# Patient Record
Sex: Male | Born: 1935 | Race: Black or African American | Hispanic: No | Smoking: Current every day smoker
Health system: Southern US, Community
[De-identification: ages and names within clinical notes are randomized; demographics above are authoritative.]

## PROBLEM LIST (undated history)

## (undated) DIAGNOSIS — I10 Essential (primary) hypertension: Secondary | ICD-10-CM

## (undated) DIAGNOSIS — F17200 Nicotine dependence, unspecified, uncomplicated: Secondary | ICD-10-CM

## (undated) DIAGNOSIS — J449 Chronic obstructive pulmonary disease, unspecified: Secondary | ICD-10-CM

## (undated) DIAGNOSIS — E7800 Pure hypercholesterolemia, unspecified: Secondary | ICD-10-CM

---

## 2015-08-20 ENCOUNTER — Inpatient Hospital Stay (HOSPITAL_COMMUNITY)
Admission: EM | Admit: 2015-08-20 | Discharge: 2015-08-29 | DRG: 208 | Disposition: A | Discharge status: Home Health | Payer: Medicare Other | Attending: Internal Medicine | Admitting: Internal Medicine

## 2015-08-20 ENCOUNTER — Encounter (HOSPITAL_COMMUNITY): Payer: Self-pay

## 2015-08-20 ENCOUNTER — Emergency Department (HOSPITAL_COMMUNITY): Payer: Medicare Other

## 2015-08-20 DIAGNOSIS — J209 Acute bronchitis, unspecified: Secondary | ICD-10-CM | POA: Diagnosis present

## 2015-08-20 DIAGNOSIS — Z4659 Encounter for fitting and adjustment of other gastrointestinal appliance and device: Secondary | ICD-10-CM

## 2015-08-20 DIAGNOSIS — G9341 Metabolic encephalopathy: Secondary | ICD-10-CM | POA: Diagnosis present

## 2015-08-20 DIAGNOSIS — J441 Chronic obstructive pulmonary disease with (acute) exacerbation: Secondary | ICD-10-CM

## 2015-08-20 DIAGNOSIS — J440 Chronic obstructive pulmonary disease with acute lower respiratory infection: Secondary | ICD-10-CM | POA: Diagnosis not present

## 2015-08-20 DIAGNOSIS — R0603 Acute respiratory distress: Secondary | ICD-10-CM

## 2015-08-20 DIAGNOSIS — Z9119 Patient's noncompliance with other medical treatment and regimen: Secondary | ICD-10-CM

## 2015-08-20 DIAGNOSIS — D696 Thrombocytopenia, unspecified: Secondary | ICD-10-CM | POA: Diagnosis present

## 2015-08-20 DIAGNOSIS — J9622 Acute and chronic respiratory failure with hypercapnia: Secondary | ICD-10-CM | POA: Diagnosis present

## 2015-08-20 DIAGNOSIS — Z7951 Long term (current) use of inhaled steroids: Secondary | ICD-10-CM

## 2015-08-20 DIAGNOSIS — I248 Other forms of acute ischemic heart disease: Secondary | ICD-10-CM | POA: Diagnosis present

## 2015-08-20 DIAGNOSIS — I639 Cerebral infarction, unspecified: Secondary | ICD-10-CM

## 2015-08-20 DIAGNOSIS — D751 Secondary polycythemia: Secondary | ICD-10-CM | POA: Diagnosis present

## 2015-08-20 DIAGNOSIS — Z9289 Personal history of other medical treatment: Secondary | ICD-10-CM

## 2015-08-20 DIAGNOSIS — R0689 Other abnormalities of breathing: Secondary | ICD-10-CM | POA: Diagnosis not present

## 2015-08-20 DIAGNOSIS — R001 Bradycardia, unspecified: Secondary | ICD-10-CM | POA: Diagnosis present

## 2015-08-20 DIAGNOSIS — Z7982 Long term (current) use of aspirin: Secondary | ICD-10-CM

## 2015-08-20 DIAGNOSIS — T380X5A Adverse effect of glucocorticoids and synthetic analogues, initial encounter: Secondary | ICD-10-CM | POA: Diagnosis present

## 2015-08-20 DIAGNOSIS — Z79899 Other long term (current) drug therapy: Secondary | ICD-10-CM

## 2015-08-20 DIAGNOSIS — R4182 Altered mental status, unspecified: Secondary | ICD-10-CM

## 2015-08-20 DIAGNOSIS — E785 Hyperlipidemia, unspecified: Secondary | ICD-10-CM | POA: Diagnosis present

## 2015-08-20 DIAGNOSIS — I10 Essential (primary) hypertension: Secondary | ICD-10-CM | POA: Diagnosis present

## 2015-08-20 DIAGNOSIS — J9692 Respiratory failure, unspecified with hypercapnia: Secondary | ICD-10-CM | POA: Diagnosis present

## 2015-08-20 DIAGNOSIS — R451 Restlessness and agitation: Secondary | ICD-10-CM | POA: Diagnosis not present

## 2015-08-20 DIAGNOSIS — R739 Hyperglycemia, unspecified: Secondary | ICD-10-CM | POA: Diagnosis present

## 2015-08-20 DIAGNOSIS — F17210 Nicotine dependence, cigarettes, uncomplicated: Secondary | ICD-10-CM | POA: Diagnosis present

## 2015-08-20 DIAGNOSIS — E876 Hypokalemia: Secondary | ICD-10-CM | POA: Diagnosis present

## 2015-08-20 DIAGNOSIS — J9601 Acute respiratory failure with hypoxia: Secondary | ICD-10-CM

## 2015-08-20 DIAGNOSIS — J9602 Acute respiratory failure with hypercapnia: Secondary | ICD-10-CM

## 2015-08-20 DIAGNOSIS — E7800 Pure hypercholesterolemia, unspecified: Secondary | ICD-10-CM | POA: Diagnosis present

## 2015-08-20 DIAGNOSIS — E872 Acidosis: Secondary | ICD-10-CM | POA: Diagnosis present

## 2015-08-20 DIAGNOSIS — J9621 Acute and chronic respiratory failure with hypoxia: Secondary | ICD-10-CM | POA: Diagnosis present

## 2015-08-20 HISTORY — DX: Pure hypercholesterolemia, unspecified: E78.00

## 2015-08-20 HISTORY — DX: Chronic obstructive pulmonary disease, unspecified: J44.9

## 2015-08-20 HISTORY — DX: Essential (primary) hypertension: I10

## 2015-08-20 HISTORY — DX: Nicotine dependence, unspecified, uncomplicated: F17.200

## 2015-08-20 LAB — BASIC METABOLIC PANEL
Anion gap: 7 (ref 5–15)
BUN: 11 mg/dL (ref 6–20)
CHLORIDE: 99 mmol/L — AB (ref 101–111)
CO2: 40 mmol/L — ABNORMAL HIGH (ref 22–32)
CREATININE: 0.79 mg/dL (ref 0.61–1.24)
Calcium: 9.6 mg/dL (ref 8.9–10.3)
Glucose, Bld: 109 mg/dL — ABNORMAL HIGH (ref 65–99)
Potassium: 3.9 mmol/L (ref 3.5–5.1)
SODIUM: 146 mmol/L — AB (ref 135–145)

## 2015-08-20 LAB — I-STAT VENOUS BLOOD GAS, ED
Acid-Base Excess: 13 mmol/L — ABNORMAL HIGH (ref 0.0–2.0)
Bicarbonate: 45.7 mEq/L — ABNORMAL HIGH (ref 20.0–24.0)
O2 Saturation: 64 %
PCO2 VEN: 95 mmHg — AB (ref 45.0–50.0)
PH VEN: 7.29 (ref 7.250–7.300)
TCO2: 49 mmol/L (ref 0–100)
pO2, Ven: 40 mmHg (ref 31.0–45.0)

## 2015-08-20 LAB — HEPATIC FUNCTION PANEL
ALT: 20 U/L (ref 17–63)
AST: 24 U/L (ref 15–41)
Albumin: 3.8 g/dL (ref 3.5–5.0)
Alkaline Phosphatase: 44 U/L (ref 38–126)
BILIRUBIN DIRECT: 0.2 mg/dL (ref 0.1–0.5)
BILIRUBIN INDIRECT: 0.3 mg/dL (ref 0.3–0.9)
BILIRUBIN TOTAL: 0.5 mg/dL (ref 0.3–1.2)
Total Protein: 7 g/dL (ref 6.5–8.1)

## 2015-08-20 LAB — TROPONIN I
TROPONIN I: 0.06 ng/mL — AB (ref <0.03)
TROPONIN I: 0.06 ng/mL — AB (ref <0.03)

## 2015-08-20 LAB — CBC
HCT: 55.6 % — ABNORMAL HIGH (ref 39.0–52.0)
Hemoglobin: 16.6 g/dL (ref 13.0–17.0)
MCH: 30.5 pg (ref 26.0–34.0)
MCHC: 29.9 g/dL — ABNORMAL LOW (ref 30.0–36.0)
MCV: 102.2 fL — AB (ref 78.0–100.0)
PLATELETS: 162 10*3/uL (ref 150–400)
RBC: 5.44 MIL/uL (ref 4.22–5.81)
RDW: 13.6 % (ref 11.5–15.5)
WBC: 6.2 10*3/uL (ref 4.0–10.5)

## 2015-08-20 LAB — BLOOD GAS, ARTERIAL
ACID-BASE EXCESS: 14.5 mmol/L — AB (ref 0.0–2.0)
BICARBONATE: 42.4 meq/L — AB (ref 20.0–24.0)
Delivery systems: POSITIVE
Drawn by: 331001
EXPIRATORY PAP: 6
FIO2: 0.4
Inspiratory PAP: 14
Mode: POSITIVE
O2 Saturation: 90.6 %
PATIENT TEMPERATURE: 98.6
PH ART: 7.222 — AB (ref 7.350–7.450)
PO2 ART: 68.2 mmHg — AB (ref 80.0–100.0)
TCO2: 45.7 mmol/L (ref 0–100)

## 2015-08-20 LAB — LACTIC ACID, PLASMA: LACTIC ACID, VENOUS: 0.6 mmol/L (ref 0.5–1.9)

## 2015-08-20 LAB — CBG MONITORING, ED: GLUCOSE-CAPILLARY: 108 mg/dL — AB (ref 65–99)

## 2015-08-20 LAB — AMMONIA: AMMONIA: 62 umol/L — AB (ref 9–35)

## 2015-08-20 MED ORDER — ASPIRIN EC 81 MG PO TBEC
162.0000 mg | DELAYED_RELEASE_TABLET | Freq: Once | ORAL | Status: AC
  Administered 2015-08-20: 162 mg via ORAL
  Filled 2015-08-20: qty 2

## 2015-08-20 MED ORDER — PREDNISONE 20 MG PO TABS
60.0000 mg | ORAL_TABLET | Freq: Once | ORAL | Status: AC
  Administered 2015-08-20: 60 mg via ORAL
  Filled 2015-08-20: qty 3

## 2015-08-20 MED ORDER — IPRATROPIUM BROMIDE 0.02 % IN SOLN
0.5000 mg | Freq: Once | RESPIRATORY_TRACT | Status: AC
Start: 2015-08-20 — End: 2015-08-21
  Administered 2015-08-21: 0.5 mg via RESPIRATORY_TRACT
  Filled 2015-08-20: qty 2.5

## 2015-08-20 MED ORDER — ALBUTEROL SULFATE (2.5 MG/3ML) 0.083% IN NEBU
10.0000 mg | INHALATION_SOLUTION | Freq: Once | RESPIRATORY_TRACT | Status: AC
  Administered 2015-08-20: 2.5 mg via RESPIRATORY_TRACT
  Filled 2015-08-20: qty 12

## 2015-08-20 MED ORDER — IPRATROPIUM BROMIDE 0.02 % IN SOLN
0.5000 mg | Freq: Once | RESPIRATORY_TRACT | Status: AC
  Administered 2015-08-20: 0.5 mg via RESPIRATORY_TRACT
  Filled 2015-08-20: qty 2.5

## 2015-08-20 MED ORDER — ALBUTEROL SULFATE (2.5 MG/3ML) 0.083% IN NEBU
10.0000 mg | INHALATION_SOLUTION | Freq: Once | RESPIRATORY_TRACT | Status: AC
  Administered 2015-08-20: 10 mg via RESPIRATORY_TRACT

## 2015-08-20 NOTE — ED Notes (Signed)
Patient here with grandson who reports that patient has had increased sleeping, decreased appetite and just feeling sluggish the past 2 days. Has had similar event in past and CO2 was elevated. Arrived with continous oxygenat 2L. Hx of COPD. Patient alert. But disoriented to place and time

## 2015-08-20 NOTE — ED Notes (Signed)
Pt placed on Bipap and tolerating well.

## 2015-08-20 NOTE — ED Provider Notes (Signed)
CSN: [id number]     Arrival date & time 08/20/15  1621 History   First MD Initiated Contact with Patient 08/20/15 1658     Chief Complaint  Patient presents with  . Weakness  . Altered Mental Status     (Consider location/radiation/quality/duration/timing/severity/associated sxs/prior Treatment) Patient is a 82 y.o. male presenting with altered mental status. The history is provided by the patient and a relative.  Altered Mental Status Presenting symptoms: behavior changes and confusion   Severity:  Moderate Most recent episode:  Today Episode history:  Multiple Timing:  Constant Progression:  Unchanged Context: not alcohol use, not dementia and not a recent illness   Associated symptoms: difficulty breathing   Associated symptoms: no abdominal pain, no agitation, no fever, no headaches, no nausea, no rash, no slurred speech and no weakness     Past Medical History  Diagnosis Date  . COPD (chronic obstructive pulmonary disease) (HCC)   . Hypertension   . High cholesterol    History reviewed. No pertinent past surgical history. No family history on file. Social History  Substance Use Topics  . Smoking status: Current Every Day Smoker    Types: Cigarettes  . Smokeless tobacco: None  . Alcohol Use: None    Review of Systems  Constitutional: Negative for fever, chills, diaphoresis and fatigue.  HENT: Negative for congestion.   Eyes: Negative for visual disturbance.  Respiratory: Positive for shortness of breath. Negative for cough and chest tightness.   Cardiovascular: Negative for chest pain.  Gastrointestinal: Negative for nausea and abdominal pain.  Genitourinary: Negative for dysuria and flank pain.  Musculoskeletal: Negative for neck pain.  Skin: Negative for rash.  Neurological: Negative for weakness, numbness and headaches.  Psychiatric/Behavioral: Positive for confusion. Negative for agitation.      Allergies  Review of patient's allergies indicates no  known allergies.  Home Medications   Prior to Admission medications   Medication Sig Start Date End Date Taking? Authorizing Provider  amLODipine (NORVASC) 10 MG tablet Take 1 tablet by mouth daily. 08/11/15  Yes Historical Provider, MD  aspirin 81 MG tablet Take 81 mg by mouth daily.   Yes Historical Provider, MD  fenofibrate (TRICOR) 145 MG tablet Take 1 tablet by mouth daily. 08/09/15  Yes Historical Provider, MD  metoprolol tartrate (LOPRESSOR) 25 MG tablet Take 1 tablet by mouth daily. 08/11/15  Yes Historical Provider, MD  SYMBICORT 160-4.5 MCG/ACT inhaler Inhale 2 puffs into the lungs daily. 07/12/15  Yes Historical Provider, MD   BP 173/82 mmHg  Pulse 91  Temp(Src) 98.6 F (37 C) (Oral)  Resp 20  SpO2 94% Physical Exam  Constitutional: He is oriented to person, place, and time. He appears well-developed and well-nourished. No distress.  HENT:  Head: Normocephalic and atraumatic.  Eyes: Conjunctivae are normal.  Cardiovascular: Normal rate and normal heart sounds.   No murmur heard. Pulmonary/Chest:  Diminished breath sounds bilaterally  Abdominal: Soft. There is no tenderness.  Musculoskeletal: He exhibits no edema.  Neurological: He is alert and oriented to person, place, and time. No cranial nerve deficit.  Skin: Skin is warm. He is not diaphoretic.  Psychiatric: He has a normal mood and affect. His behavior is normal.  Nursing note and vitals reviewed.   ED Course  Procedures (including critical care time) Labs Review Labs Reviewed  BASIC METABOLIC PANEL - Abnormal; Notable for the following:    Sodium 146 (*)    Chloride 99 (*)    CO2 40 (*)  Glucose, Bld 109 (*)    All other components within normal limits  CBC - Abnormal; Notable for the following:    HCT 55.6 (*)    MCV 102.2 (*)    MCHC 29.9 (*)    All other components within normal limits  CBG MONITORING, ED - Abnormal; Notable for the following:    Glucose-Capillary 108 (*)    All other components  within normal limits  URINALYSIS, ROUTINE W REFLEX MICROSCOPIC (NOT AT [facility])  CBG MONITORING, ED    Imaging Review No results found. I have personally reviewed and evaluated these images and lab results as part of my medical decision-making.   EKG Interpretation None      MDM   Final diagnoses:  None   Patient presents with confusion over the last patient presents with confusion over the last 12 hours.Grandson reports that he had multiple similar previous episodes in which she was told his carbon dioxide was too high. He was [facility] for acute hypercarbic respiratory failure.   On arrival his VBG shows pH of 7.29 with PCO2 95. BiPAP was started and pH worsened to 7.21 with PCO2 greater than 100. Repeat ABG again 7.21 with PCO2 greater than 100. Patient mental status unchanged with mild confusion and sleepiness.  Laboratory evaluation significant for normal white blood cell count, unremarkable BMP. Lactate wnl. Ammonia mildly elevated. Troponins 0.06 at 0 and 3 hours. Given 162 ASA in the ED. Chest pain free. Given 60 of prednisone in ED with 2 breathing treatments.   Patient admitted on BiPAP to step down unit for acute hypercarbic respiratory failure.   Levora AngelEric Mitzi Lilja, MD 08/20/15 2350  Azalia BilisKevin Campos, MD 08/20/15 2352

## 2015-08-21 ENCOUNTER — Encounter (HOSPITAL_COMMUNITY): Payer: Self-pay | Admitting: Internal Medicine

## 2015-08-21 ENCOUNTER — Inpatient Hospital Stay (HOSPITAL_COMMUNITY): Payer: Medicare Other

## 2015-08-21 DIAGNOSIS — R451 Restlessness and agitation: Secondary | ICD-10-CM | POA: Diagnosis not present

## 2015-08-21 DIAGNOSIS — J9600 Acute respiratory failure, unspecified whether with hypoxia or hypercapnia: Secondary | ICD-10-CM | POA: Diagnosis not present

## 2015-08-21 DIAGNOSIS — D696 Thrombocytopenia, unspecified: Secondary | ICD-10-CM | POA: Diagnosis present

## 2015-08-21 DIAGNOSIS — J9622 Acute and chronic respiratory failure with hypercapnia: Secondary | ICD-10-CM | POA: Diagnosis present

## 2015-08-21 DIAGNOSIS — R739 Hyperglycemia, unspecified: Secondary | ICD-10-CM | POA: Diagnosis present

## 2015-08-21 DIAGNOSIS — E7800 Pure hypercholesterolemia, unspecified: Secondary | ICD-10-CM | POA: Diagnosis present

## 2015-08-21 DIAGNOSIS — I248 Other forms of acute ischemic heart disease: Secondary | ICD-10-CM | POA: Diagnosis present

## 2015-08-21 DIAGNOSIS — Z7951 Long term (current) use of inhaled steroids: Secondary | ICD-10-CM | POA: Diagnosis not present

## 2015-08-21 DIAGNOSIS — E876 Hypokalemia: Secondary | ICD-10-CM | POA: Diagnosis present

## 2015-08-21 DIAGNOSIS — J9621 Acute and chronic respiratory failure with hypoxia: Secondary | ICD-10-CM | POA: Diagnosis present

## 2015-08-21 DIAGNOSIS — E785 Hyperlipidemia, unspecified: Secondary | ICD-10-CM | POA: Diagnosis present

## 2015-08-21 DIAGNOSIS — J9692 Respiratory failure, unspecified with hypercapnia: Secondary | ICD-10-CM | POA: Diagnosis present

## 2015-08-21 DIAGNOSIS — R0689 Other abnormalities of breathing: Secondary | ICD-10-CM | POA: Diagnosis present

## 2015-08-21 DIAGNOSIS — F17210 Nicotine dependence, cigarettes, uncomplicated: Secondary | ICD-10-CM | POA: Diagnosis present

## 2015-08-21 DIAGNOSIS — T380X5A Adverse effect of glucocorticoids and synthetic analogues, initial encounter: Secondary | ICD-10-CM | POA: Diagnosis present

## 2015-08-21 DIAGNOSIS — R404 Transient alteration of awareness: Secondary | ICD-10-CM | POA: Diagnosis not present

## 2015-08-21 DIAGNOSIS — I10 Essential (primary) hypertension: Secondary | ICD-10-CM | POA: Diagnosis present

## 2015-08-21 DIAGNOSIS — E872 Acidosis: Secondary | ICD-10-CM | POA: Diagnosis present

## 2015-08-21 DIAGNOSIS — Z9119 Patient's noncompliance with other medical treatment and regimen: Secondary | ICD-10-CM | POA: Diagnosis not present

## 2015-08-21 DIAGNOSIS — R7989 Other specified abnormal findings of blood chemistry: Secondary | ICD-10-CM | POA: Diagnosis not present

## 2015-08-21 DIAGNOSIS — J9601 Acute respiratory failure with hypoxia: Secondary | ICD-10-CM | POA: Diagnosis present

## 2015-08-21 DIAGNOSIS — J209 Acute bronchitis, unspecified: Secondary | ICD-10-CM | POA: Diagnosis present

## 2015-08-21 DIAGNOSIS — J441 Chronic obstructive pulmonary disease with (acute) exacerbation: Secondary | ICD-10-CM | POA: Diagnosis present

## 2015-08-21 DIAGNOSIS — Z79899 Other long term (current) drug therapy: Secondary | ICD-10-CM | POA: Diagnosis not present

## 2015-08-21 DIAGNOSIS — G9340 Encephalopathy, unspecified: Secondary | ICD-10-CM | POA: Diagnosis not present

## 2015-08-21 DIAGNOSIS — R001 Bradycardia, unspecified: Secondary | ICD-10-CM | POA: Diagnosis present

## 2015-08-21 DIAGNOSIS — J9602 Acute respiratory failure with hypercapnia: Secondary | ICD-10-CM

## 2015-08-21 DIAGNOSIS — Z7982 Long term (current) use of aspirin: Secondary | ICD-10-CM | POA: Diagnosis not present

## 2015-08-21 DIAGNOSIS — R410 Disorientation, unspecified: Secondary | ICD-10-CM | POA: Diagnosis not present

## 2015-08-21 DIAGNOSIS — J440 Chronic obstructive pulmonary disease with acute lower respiratory infection: Secondary | ICD-10-CM | POA: Diagnosis present

## 2015-08-21 DIAGNOSIS — D751 Secondary polycythemia: Secondary | ICD-10-CM | POA: Diagnosis present

## 2015-08-21 DIAGNOSIS — G9341 Metabolic encephalopathy: Secondary | ICD-10-CM | POA: Diagnosis present

## 2015-08-21 LAB — BLOOD GAS, ARTERIAL
ACID-BASE EXCESS: 14.9 mmol/L — AB (ref 0.0–2.0)
Acid-Base Excess: 10.9 mmol/L — ABNORMAL HIGH (ref 0.0–2.0)
BICARBONATE: 43 meq/L — AB (ref 20.0–24.0)
Bicarbonate: 34.4 mEq/L — ABNORMAL HIGH (ref 20.0–24.0)
Delivery systems: POSITIVE
Drawn by: 365271
Drawn by: 46203
EXPIRATORY PAP: 6
FIO2: 0.6
FIO2: 0.6
INSPIRATORY PAP: 18
MECHVT: 620 mL
O2 SAT: 93.7 %
O2 Saturation: 95.1 %
PATIENT TEMPERATURE: 98.2
PEEP: 5 cmH2O
PH ART: 7.22 — AB (ref 7.350–7.450)
PO2 ART: 80.5 mmHg (ref 80.0–100.0)
Patient temperature: 97.4
RATE: 15 resp/min
RATE: 20 resp/min
TCO2: 46.4 mmol/L (ref 0–100)
TCO2: 35.7 mmol/L (ref 0–100)
pCO2 arterial: 39.4 mmHg (ref 35.0–45.0)
pH, Arterial: 7.547 — ABNORMAL HIGH (ref 7.350–7.450)
pO2, Arterial: 60.7 mmHg — ABNORMAL LOW (ref 80.0–100.0)

## 2015-08-21 LAB — I-STAT ARTERIAL BLOOD GAS, ED
Acid-Base Excess: 3 mmol/L — ABNORMAL HIGH (ref 0.0–2.0)
Bicarbonate: 29.5 mEq/L — ABNORMAL HIGH (ref 20.0–24.0)
O2 Saturation: 95 %
PCO2 ART: 51.8 mmHg — AB (ref 35.0–45.0)
PH ART: 7.361 (ref 7.350–7.450)
PO2 ART: 81 mmHg (ref 80.0–100.0)
Patient temperature: 98
TCO2: 31 mmol/L (ref 0–100)

## 2015-08-21 LAB — BASIC METABOLIC PANEL
ANION GAP: 9 (ref 5–15)
BUN: 14 mg/dL (ref 6–20)
CALCIUM: 9.4 mg/dL (ref 8.9–10.3)
CO2: 35 mmol/L — AB (ref 22–32)
Chloride: 99 mmol/L — ABNORMAL LOW (ref 101–111)
Creatinine, Ser: 0.67 mg/dL (ref 0.61–1.24)
GFR calc non Af Amer: >60 mL/min (ref >60)
Glucose, Bld: 132 mg/dL — ABNORMAL HIGH (ref 65–99)
POTASSIUM: 4.4 mmol/L (ref 3.5–5.1)
Sodium: 143 mmol/L (ref 135–145)

## 2015-08-21 LAB — CBC
HEMATOCRIT: 56.3 % — AB (ref 39.0–52.0)
HEMOGLOBIN: 16.8 g/dL (ref 13.0–17.0)
MCH: 30.4 pg (ref 26.0–34.0)
MCHC: 29.8 g/dL — ABNORMAL LOW (ref 30.0–36.0)
MCV: 102 fL — AB (ref 78.0–100.0)
Platelets: 144 10*3/uL — ABNORMAL LOW (ref 150–400)
RBC: 5.52 MIL/uL (ref 4.22–5.81)
RDW: 13.5 % (ref 11.5–15.5)
WBC: 5.7 10*3/uL (ref 4.0–10.5)

## 2015-08-21 LAB — GLUCOSE, CAPILLARY
GLUCOSE-CAPILLARY: 178 mg/dL — AB (ref 65–99)
GLUCOSE-CAPILLARY: 181 mg/dL — AB (ref 65–99)
GLUCOSE-CAPILLARY: 173 mg/dL — AB (ref 65–99)
Glucose-Capillary: 116 mg/dL — ABNORMAL HIGH (ref 65–99)
Glucose-Capillary: 182 mg/dL — ABNORMAL HIGH (ref 65–99)

## 2015-08-21 LAB — TROPONIN I
TROPONIN I: 0.03 ng/mL — AB (ref <0.03)
Troponin I: 0.06 ng/mL (ref <0.03)
Troponin I: 0.06 ng/mL (ref <0.03)

## 2015-08-21 LAB — MRSA PCR SCREENING: MRSA by PCR: NEGATIVE

## 2015-08-21 MED ORDER — FAMOTIDINE IN NACL 20-0.9 MG/50ML-% IV SOLN
20.0000 mg | Freq: Two times a day (BID) | INTRAVENOUS | Status: DC
  Administered 2015-08-21 – 2015-08-24 (×8): 20 mg via INTRAVENOUS
  Filled 2015-08-21 (×9): qty 50

## 2015-08-21 MED ORDER — SODIUM CHLORIDE 0.9 % IV SOLN
INTRAVENOUS | Status: DC
  Administered 2015-08-21 – 2015-08-24 (×4): via INTRAVENOUS

## 2015-08-21 MED ORDER — CLONIDINE HCL 0.1 MG PO TABS
0.1000 mg | ORAL_TABLET | Freq: Three times a day (TID) | ORAL | Status: DC
  Administered 2015-08-21: 0.1 mg
  Filled 2015-08-21: qty 1

## 2015-08-21 MED ORDER — SODIUM CHLORIDE 0.9 % IV SOLN: 250.0000 mL | INTRAVENOUS | Status: DC | PRN

## 2015-08-21 MED ORDER — IPRATROPIUM-ALBUTEROL 0.5-2.5 (3) MG/3ML IN SOLN
3.0000 mL | Freq: Four times a day (QID) | RESPIRATORY_TRACT | Status: DC
  Administered 2015-08-21 – 2015-08-24 (×13): 3 mL via RESPIRATORY_TRACT
  Filled 2015-08-21 (×13): qty 3

## 2015-08-21 MED ORDER — ENOXAPARIN SODIUM 40 MG/0.4ML ~~LOC~~ SOLN
40.0000 mg | SUBCUTANEOUS | Status: DC
  Administered 2015-08-21 – 2015-08-29 (×9): 40 mg via SUBCUTANEOUS
  Filled 2015-08-21 (×9): qty 0.4

## 2015-08-21 MED ORDER — MIDAZOLAM HCL 2 MG/2ML IJ SOLN
4.0000 mg | Freq: Once | INTRAMUSCULAR | Status: AC
  Administered 2015-08-21: 4 mg via INTRAVENOUS

## 2015-08-21 MED ORDER — ROCURONIUM BROMIDE 50 MG/5ML IV SOLN
50.0000 mg | Freq: Once | INTRAVENOUS | Status: AC
  Administered 2015-08-21: 50 mg via INTRAVENOUS
  Filled 2015-08-21: qty 5

## 2015-08-21 MED ORDER — LEVOFLOXACIN IN D5W 500 MG/100ML IV SOLN
500.0000 mg | INTRAVENOUS | Status: DC
  Administered 2015-08-22 – 2015-08-25 (×4): 500 mg via INTRAVENOUS
  Filled 2015-08-21 (×5): qty 100

## 2015-08-21 MED ORDER — CHLORHEXIDINE GLUCONATE 0.12% ORAL RINSE (MEDLINE KIT)
15.0000 mL | Freq: Two times a day (BID) | OROMUCOSAL | Status: DC
  Administered 2015-08-21 – 2015-08-25 (×7): 15 mL via OROMUCOSAL

## 2015-08-21 MED ORDER — DEXMEDETOMIDINE HCL IN NACL 400 MCG/100ML IV SOLN
0.4000 ug/kg/h | INTRAVENOUS | Status: DC
  Administered 2015-08-21: 0.4 ug/kg/h via INTRAVENOUS
  Administered 2015-08-21: 0.6 ug/kg/h via INTRAVENOUS
  Administered 2015-08-21: 0.8 ug/kg/h via INTRAVENOUS
  Administered 2015-08-21: 0.6 ug/kg/h via INTRAVENOUS
  Administered 2015-08-21: 0.8 ug/kg/h via INTRAVENOUS
  Administered 2015-08-21: 0.6 ug/kg/h via INTRAVENOUS
  Administered 2015-08-22 (×2): 1.2 ug/kg/h via INTRAVENOUS
  Administered 2015-08-22: 0.8 ug/kg/h via INTRAVENOUS
  Administered 2015-08-22: 1 ug/kg/h via INTRAVENOUS
  Administered 2015-08-22: 1.2 ug/kg/h via INTRAVENOUS
  Administered 2015-08-22: 1 ug/kg/h via INTRAVENOUS
  Administered 2015-08-23 (×4): 1.2 ug/kg/h via INTRAVENOUS
  Filled 2015-08-21: qty 50
  Filled 2015-08-21 (×4): qty 100
  Filled 2015-08-21: qty 50
  Filled 2015-08-21 (×3): qty 100
  Filled 2015-08-21 (×3): qty 50
  Filled 2015-08-21 (×3): qty 100
  Filled 2015-08-21: qty 50

## 2015-08-21 MED ORDER — FENTANYL CITRATE (PF) 100 MCG/2ML IJ SOLN
25.0000 ug | INTRAMUSCULAR | Status: DC | PRN
  Administered 2015-08-21 – 2015-08-23 (×10): 50 ug via INTRAVENOUS
  Filled 2015-08-21 (×11): qty 2

## 2015-08-21 MED ORDER — CETYLPYRIDINIUM CHLORIDE 0.05 % MT LIQD: 7.0000 mL | Freq: Two times a day (BID) | OROMUCOSAL | Status: DC

## 2015-08-21 MED ORDER — FENTANYL CITRATE (PF) 100 MCG/2ML IJ SOLN
200.0000 ug | Freq: Once | INTRAMUSCULAR | Status: AC
  Administered 2015-08-21: 200 ug via INTRAVENOUS

## 2015-08-21 MED ORDER — ALBUTEROL SULFATE (2.5 MG/3ML) 0.083% IN NEBU
2.5000 mg | INHALATION_SOLUTION | Freq: Once | RESPIRATORY_TRACT | Status: AC
  Administered 2015-08-21: 2.5 mg via RESPIRATORY_TRACT
  Filled 2015-08-21: qty 3

## 2015-08-21 MED ORDER — MIDAZOLAM HCL 2 MG/2ML IJ SOLN
INTRAMUSCULAR | Status: AC
  Administered 2015-08-21: 4 mg via INTRAVENOUS
  Filled 2015-08-21: qty 4

## 2015-08-21 MED ORDER — ALBUTEROL SULFATE (2.5 MG/3ML) 0.083% IN NEBU: 2.5000 mg | INHALATION_SOLUTION | RESPIRATORY_TRACT | Status: DC | PRN

## 2015-08-21 MED ORDER — LEVOFLOXACIN IN D5W 750 MG/150ML IV SOLN
750.0000 mg | Freq: Once | INTRAVENOUS | Status: AC
  Administered 2015-08-21: 750 mg via INTRAVENOUS
  Filled 2015-08-21: qty 150

## 2015-08-21 MED ORDER — CHLORHEXIDINE GLUCONATE 0.12 % MT SOLN
15.0000 mL | Freq: Two times a day (BID) | OROMUCOSAL | Status: DC
  Administered 2015-08-21: 15 mL via OROMUCOSAL

## 2015-08-21 MED ORDER — METHYLPREDNISOLONE SODIUM SUCC 125 MG IJ SOLR
60.0000 mg | Freq: Three times a day (TID) | INTRAMUSCULAR | Status: DC
  Administered 2015-08-21 – 2015-08-22 (×4): 60 mg via INTRAVENOUS
  Filled 2015-08-21: qty 2
  Filled 2015-08-21 (×4): qty 0.96

## 2015-08-21 MED ORDER — ANTISEPTIC ORAL RINSE SOLUTION (CORINZ)
7.0000 mL | Freq: Four times a day (QID) | OROMUCOSAL | Status: DC
  Administered 2015-08-21 – 2015-08-24 (×12): 7 mL via OROMUCOSAL

## 2015-08-21 MED ORDER — VITAL HIGH PROTEIN PO LIQD
1000.0000 mL | ORAL | Status: DC
  Administered 2015-08-21: 1000 mL
  Administered 2015-08-21: 20:00:00
  Administered 2015-08-22: 1000 mL
  Administered 2015-08-23: 09:00:00
  Filled 2015-08-21: qty 1000

## 2015-08-21 MED ORDER — FENTANYL CITRATE (PF) 100 MCG/2ML IJ SOLN
INTRAMUSCULAR | Status: AC
  Administered 2015-08-21: 200 ug via INTRAVENOUS
  Filled 2015-08-21: qty 4

## 2015-08-21 MED ORDER — PRO-STAT SUGAR FREE PO LIQD
30.0000 mL | Freq: Every day | ORAL | Status: DC
  Administered 2015-08-21 – 2015-08-23 (×11): 30 mL
  Filled 2015-08-21 (×14): qty 30

## 2015-08-21 MED ORDER — ETOMIDATE 2 MG/ML IV SOLN
20.0000 mg | Freq: Once | INTRAVENOUS | Status: AC
  Administered 2015-08-21: 20 mg via INTRAVENOUS

## 2015-08-21 NOTE — ED Notes (Signed)
Pt remains on Bi-pap.  Resting with eyes closed at this time

## 2015-08-21 NOTE — Progress Notes (Signed)
Initial Nutrition Assessment  DOCUMENTATION CODES:   Obesity unspecified  INTERVENTION:    Initiate TF via OGT with Vital High Protein at goal rate of 40 ml/h (960 ml per day) and Prostat 30 ml 5 times per day to provide 1460 kcals, 159 gm protein, 803 ml free water daily.  NUTRITION DIAGNOSIS:   Inadequate oral intake related to inability to eat as evidenced by NPO status.  GOAL:   Provide needs based on ASPEN/SCCM guidelines  MONITOR:   Vent status, Labs, Weight trends, TF tolerance, I & O's  REASON FOR ASSESSMENT:   Consult Enteral/tube feeding initiation and management  ASSESSMENT:   82 year old male with COPD presented with AMS. In ED found to have hypercarbic failure and started on BiPAP. Required intubation 7/6.  Labs reviewed. CBG's: 108-116 Medications reviewed and include pepcid, solumedrol. Nutrition-Focused physical exam completed. Findings are no fat depletion, mild-moderate muscle depletion, and no edema.  No family present to provide nutrition hx. Received MD Consult for TF initiation and management. Patient is currently intubated on ventilator support MV: 12.8 L/min Temp (24hrs), Avg:98 F (36.7 C), Min:97.5 F (36.4 C), Max:98.6 F (37 C)  Propofol: none  Diet Order:  Diet NPO time specified  Skin:  Reviewed, no issues  Last BM:  7/5  Height:   Ht Readings from Last 1 Encounters:  08/21/15 6' (1.829 m)    Weight:   Wt Readings from Last 1 Encounters:  08/21/15 229 lb 11.5 oz (104.2 kg)    Ideal Body Weight:  80.9 kg  BMI:  Body mass index is 31.15 kg/(m^2).  Estimated Nutritional Needs:   Kcal:  [id number]  Protein:  162 gm  Fluid:  1.8-2 L  EDUCATION NEEDS:   No education needs identified at this time  Joaquin CourtsKimberly Harris, RD, LDN, CNSC Pager [phone] After Hours Pager [phone]

## 2015-08-21 NOTE — Procedures (Signed)
Intubation Procedure Note Sean Arellano [id number] January 13, 1930  Procedure: Intubation Indications: Respiratory insufficiency  Procedure Details Consent: Unable to obtain consent because of altered level of consciousness. Time Out: Verified patient identification, verified procedure, site/side was marked, verified correct patient position, special equipment/implants available, medications/allergies/relevent history reviewed, required imaging and test results available.  Performed  Maximum sterile technique was used including gloves, hand hygiene and mask.  MAC    Evaluation Hemodynamic Status: BP stable throughout; O2 sats: stable throughout Patient's Current Condition: stable Complications: No apparent complications Patient did tolerate procedure well. Chest X-ray ordered to verify placement.  CXR: pending.   Sean BoundYACOUB,Sean Arellano 08/21/2015

## 2015-08-21 NOTE — Progress Notes (Signed)
Handoff communication received from Carlyon ProwsShakiera Whyte, Charity fundraiserN.

## 2015-08-21 NOTE — Care Management Important Message (Signed)
Important Message  Patient Details  Name: Sean Arellano MRN: [id number] Date of Birth: [birth year]   Medicare Important Message Given:  Yes    Bernadette HoitShoffner, Aloysuis Ribaudo Coleman 08/21/2015, 10:23 AM

## 2015-08-21 NOTE — ED Notes (Signed)
Pt receiving breathing tx at this time 

## 2015-08-21 NOTE — Progress Notes (Signed)
Pt intubated by ICU MD. 7.5 ETT secured at 25 at lip with tube holder. Pt tol well. BBS equal, chest rise equal bilaterally. VS stable. Will cont to monitor

## 2015-08-21 NOTE — Progress Notes (Signed)
eLink Physician-Brief Progress Note Patient Name: Sean ConchJames Knutzen DOB: 06/23/1929 MRN: [id number]   Date of Service  08/21/2015  HPI/Events of Note  bp up off lopressor and norvasc  eICU Interventions  Try clonidine per G tube 0.1 mg tid     Intervention Category Major Interventions: Hypertension - evaluation and management  Sandrea HughsMichael Kemoni Ortega 08/21/2015, 8:17 PM

## 2015-08-21 NOTE — Progress Notes (Signed)
Patient transported on BiPap from ED to room 107M-11 without complications.

## 2015-08-21 NOTE — Progress Notes (Signed)
ABG Critical Results reported to RN at 04:01

## 2015-08-21 NOTE — H&P (Addendum)
PULMONARY / CRITICAL CARE MEDICINE   Name: Sean Arellano MRN: [id number] DOB: 1929-05-24    ADMISSION DATE:  08/20/2015 CONSULTATION DATE:  7/6  REFERRING MD:  Dr. Patria Maneampos EDP  CHIEF COMPLAINT:  AMS  HISTORY OF PRESENT ILLNESS:   82 year old male with PMH as below, which is significant for COPD and [facility]carb[facility] respiratory [facility]. [facility] with son who reports altered mental status for 12 hours prior to arrival. Per son this is a very similar presentation to [facility]. Patient denies any significant cough, fevers, chills, or chest pain. Venous blood gas in the emergency department with pH 7.29 and CO2 95. He was placed on BiPAP, however, blood gas only worsened. CXR with no evidence of acute process. Due to lack of improvement on BiPAP, PCCM has been asked to admit.  PAST MEDICAL HISTORY :  He  has a past medical history of COPD (chronic obstructive pulmonary disease) (HCC); Hypertension; and High cholesterol.  PAST SURGICAL HISTORY: He  has no past surgical history on file.  No Known Allergies  No current facility-administered medications on file prior to encounter.   No current outpatient prescriptions on file prior to encounter.    FAMILY HISTORY:  His has no family status information on file.   SOCIAL HISTORY: He  reports that he has been smoking Cigarettes.  He does not have any smokeless tobacco history on file.  REVIEW OF SYSTEMS:   Bolds are positive  Constitutional: weight loss, gain, night sweats, Fevers, chills, fatigue .  HEENT: headaches, Sore throat, sneezing, nasal congestion, post nasal drip, Difficulty swallowing, Tooth/dental problems, visual complaints visual changes, ear ache CV:  chest pain, radiates: ,Orthopnea, PND, swelling in lower extremities, dizziness, palpitations, syncope.  GI  heartburn, indigestion, abdominal pain, nausea, vomiting, diarrhea, change in bowel habits, loss of  appetite, bloody stools.  Resp: cough, productive: , hemoptysis, dyspnea, chest pain, pleuritic.  Skin: rash or itching or icterus GU: dysuria, change in color of urine, urgency or frequency. flank pain, hematuria  MS: joint pain or swelling. decreased range of motion  Psych: change in mood or affect. depression or anxiety.  Neuro: difficulty with speech, weakness, numbness, ataxia    SUBJECTIVE:   VITAL SIGNS: BP 164/77 mmHg  Pulse 87  Temp(Src) 98.6 F (37 C) (Oral)  Resp 18  SpO2 94%  HEMODYNAMICS:    VENTILATOR SETTINGS: Vent Mode:  [-]  FiO2 (%):  [2 %-40 %] 40 %  INTAKE / OUTPUT:    PHYSICAL EXAMINATION: General:  Male of normal body habitus in NAD Neuro:  Alert, oriented, non-focal HEENT:  [place]/AT, PERRL, no JVD Cardiovascular:  RRR, no MRG Lungs:  Poor air movement, no wheeze Abdomen:  Soft, non-tender, non-distended Musculoskeletal:  No acute deformity or ROM limitation Skin:  Grossly intact  LABS:  BMET  Recent Labs Lab 08/20/15 1633  NA 146*  K 3.9  CL 99*  CO2 40*  BUN 11  CREATININE 0.79  GLUCOSE 109*    Electrolytes  Recent Labs Lab 08/20/15 1633  CALCIUM 9.6    CBC  Recent Labs Lab 08/20/15 1633  WBC 6.2  HGB 16.6  HCT 55.6*  PLT 162    Coag's No results for input(s): APTT, INR in the last 168 hours.  Sepsis Markers  Recent Labs Lab 08/20/15 1851  LATICACIDVEN 0.6    ABG  Recent Labs Lab 08/20/15 2237  PHART 7.222*  PCO2ART  ABOVE REPORTABLE RANGE  PO2ART 68.2*    Liver Enzymes  Recent Labs Lab 08/20/15 1851  AST 24  ALT 20  ALKPHOS 44  BILITOT 0.5  ALBUMIN 3.8    Cardiac Enzymes  Recent Labs Lab 08/20/15 1851 08/20/15 2059  TROPONINI 0.06* 0.06*    Glucose  Recent Labs Lab 08/20/15 1629  GLUCAP 108*    Imaging Dg Chest 2 View  08/20/2015  CLINICAL DATA:  Decreased appetite and increased lethargy over the past 2 days. EXAM: CHEST  2 VIEW COMPARISON:  None. FINDINGS: The lungs are  clear. Heart size is mildly enlarged. No pneumothorax or pleural effusion. No focal bony abnormality. Aortic atherosclerosis noted. IMPRESSION: Cardiomegaly without acute disease. Atherosclerosis. Electronically Signed   By: Drusilla Kannerhomas  Dalessio M.D.   On: 08/20/2015 18:26     STUDIES:    CULTURES: BCx2 7/6 >  ANTIBIOTICS: levaquin 7/6 >  SIGNIFICANT EVENTS: 7/5 admit  LINES/TUBES:   DISCUSSION: 82 year old male with COPD presented with AMS. In ED found to have hypercarbic failure and started on BiPAP. ABG slow to improve, however, mental status better in ED. With persistent acidosis despite BiPAP admit to ICU. Steroids, BDs, Levaquin. BiPAP settings adjusted to achieve higher MV, as he was only pulling Vt of 250 on initial settings. Now getting 500mL Vt. Hope to avoid worsening and intubation.   ASSESSMENT / PLAN:  PULMONARY A: Acute hypercarbic respiratory failure secondary to COPD with acute exacerbation  P:   Continue BiPAP, settings reviewed and adjusted to achieve larger Vt Scheduled and PRN nebulized bronchodilators Systemic steroids > Solumedrol 60mg  q8 hours ABG in AM Pulmonary hygiene Hold home symbicort  CARDIOVASCULAR A:  Elevated troponin, suspect related to demand ischemia H/o HTN, HLD  P:  Telemetry monitoring Holding home PO amlodipine, fenofibrate, metoprolol tonight while NPO on BiPAP PRN IV metoprolol Trend troponin  RENAL A:   No acute issues  P:   Follow BMP Correct electrolytes as indicated  GASTROINTESTINAL A:   No acute issues  P:   NPO for tonight while on BiPAP Pepcid for SUP  HEMATOLOGIC A:   No acute issues  P:  SQ lovenox for VTE ppx  INFECTIOUS A:   No acute issues  P:   Levaquin for AECOPD  ENDOCRINE A:   No acute issues   P:   Follow glucose on chemistry  NEUROLOGIC A:   Acute metabolic encephalopathy secondary to hypercarbia  P:   Close monitoring of mental status, if worsens with pursue intubation  for mechanical ventilation.  FAMILY  - Updates: [facility]  - Inter-disciplinary family meet or Palliative Care meeting due by: 7/13   Joneen RoachPaul Hoffman, AGACNP-BC [place] Pulmonology/Critical Care Pager [phone] or [phone]  08/21/2015 1:33 AM  Attending Note:  82 year old male with COPD who [facility] who was found altered by son.  Son brought patient to the ED and was found to be in acute hypercarbic respiratory failure.  The patient was placed on BiPAP but ABG continued to worsen so PCCM was asked to admit.  Patient was admitted to the ICU and mental status improved however upon evaluating on 7/6 patient had no mental status and was unable to protect his airway.  I called the patient's grandson Sean Arellano to confirm code status but no answer and a message was left.  Also, called the patient's home number listed but nobody picked up and leaving a  message was not an option.  Therefore, will proceed with intubation at this time and will increased minute ventilation.  GOC needs to be discussed but unfortunately nobody is available.  Will intubate, continue abx, steroids and bronchodilator.  The patient is critically ill with multiple organ systems failure and requires high complexity decision making for assessment and support, frequent evaluation and titration of therapies, application of advanced monitoring technologies and extensive interpretation of multiple databases.   Critical Care Time devoted to patient care services described in this note is  50  Minutes. This time reflects time of care of this signee Dr Koren Bound. This critical care time does not reflect procedure time, or teaching time or supervisory time of PA/NP/Med student/Med Resident etc but could involve care discussion time.  Alyson Reedy, M.D. [facility] Pulmonary/Critical Care Medicine. Pager: [phone]. After hours pager: [phone].

## 2015-08-21 NOTE — Procedures (Signed)
OGT inserted by MD under direct laryngoscopy and confirmed by auscultation.  Alyson ReedyWesam G. Derrien Anschutz, M.D. [facility] Pulmonary/Critical Care Medicine. Pager: [phone]. After hours pager: [phone].

## 2015-08-21 NOTE — Progress Notes (Signed)
Pharmacy Antibiotic Note  Zachery ConchJames Jake is a 82 y.o. male admitted on 08/20/2015 with COPD exacerbation.  Pharmacy has been consulted for Levaquin dosing.  Plan: Levaquin 750mg  IV now then 500mg  IV every 24 hours.   Temp (24hrs), Avg:98.6 F (37 C), Min:98.6 F (37 C), Max:98.6 F (37 C)   Recent Labs Lab 08/20/15 1633 08/20/15 1851  WBC 6.2  --   CREATININE 0.79  --   LATICACIDVEN  --  0.6     Thank you for allowing pharmacy to be a part of this patient's care.  Vernard GamblesVeronda Lovis More, PharmD, BCPS  08/21/2015 1:42 AM

## 2015-08-22 ENCOUNTER — Inpatient Hospital Stay (HOSPITAL_COMMUNITY): Payer: Medicare Other

## 2015-08-22 DIAGNOSIS — J9621 Acute and chronic respiratory failure with hypoxia: Secondary | ICD-10-CM

## 2015-08-22 DIAGNOSIS — G9340 Encephalopathy, unspecified: Secondary | ICD-10-CM

## 2015-08-22 DIAGNOSIS — J209 Acute bronchitis, unspecified: Secondary | ICD-10-CM

## 2015-08-22 LAB — BASIC METABOLIC PANEL
ANION GAP: 8 (ref 5–15)
Anion gap: 9 (ref 5–15)
BUN: 25 mg/dL — AB (ref 6–20)
BUN: 27 mg/dL — ABNORMAL HIGH (ref 6–20)
CALCIUM: 9.5 mg/dL (ref 8.9–10.3)
CHLORIDE: 102 mmol/L (ref 101–111)
CO2: 30 mmol/L (ref 22–32)
CO2: 31 mmol/L (ref 22–32)
CREATININE: 1.11 mg/dL (ref 0.61–1.24)
Calcium: 9.2 mg/dL (ref 8.9–10.3)
Chloride: 104 mmol/L (ref 101–111)
Creatinine, Ser: 0.93 mg/dL (ref 0.61–1.24)
GFR calc Af Amer: >60 mL/min (ref >60)
GFR calc non Af Amer: 59 mL/min — ABNORMAL LOW (ref >60)
GFR calc non Af Amer: >60 mL/min (ref >60)
GLUCOSE: 175 mg/dL — AB (ref 65–99)
GLUCOSE: 172 mg/dL — AB (ref 65–99)
POTASSIUM: 3.2 mmol/L — AB (ref 3.5–5.1)
Potassium: 3.2 mmol/L — ABNORMAL LOW (ref 3.5–5.1)
Sodium: 141 mmol/L (ref 135–145)
Sodium: 143 mmol/L (ref 135–145)

## 2015-08-22 LAB — RESPIRATORY PANEL BY PCR
ADENOVIRUS-RVPPCR: NOT DETECTED
Bordetella pertussis: NOT DETECTED
CORONAVIRUS 229E-RVPPCR: NOT DETECTED
CORONAVIRUS NL63-RVPPCR: NOT DETECTED
CORONAVIRUS OC43-RVPPCR: NOT DETECTED
Chlamydophila pneumoniae: NOT DETECTED
Coronavirus HKU1: NOT DETECTED
INFLUENZA A H1 2009-RVPPR: NOT DETECTED
INFLUENZA A H1-RVPPCR: NOT DETECTED
INFLUENZA B-RVPPCR: NOT DETECTED
Influenza A H3: NOT DETECTED
Influenza A: NOT DETECTED
MYCOPLASMA PNEUMONIAE-RVPPCR: NOT DETECTED
Metapneumovirus: NOT DETECTED
PARAINFLUENZA VIRUS 1-RVPPCR: NOT DETECTED
Parainfluenza Virus 2: NOT DETECTED
Parainfluenza Virus 3: NOT DETECTED
Parainfluenza Virus 4: NOT DETECTED
RESPIRATORY SYNCYTIAL VIRUS-RVPPCR: NOT DETECTED
Rhinovirus / Enterovirus: NOT DETECTED

## 2015-08-22 LAB — URINALYSIS, ROUTINE W REFLEX MICROSCOPIC
Bilirubin Urine: NEGATIVE
Glucose, UA: NEGATIVE mg/dL
HGB URINE DIPSTICK: NEGATIVE
Ketones, ur: NEGATIVE mg/dL
LEUKOCYTES UA: NEGATIVE
Nitrite: NEGATIVE
PROTEIN: 30 mg/dL — AB
SPECIFIC GRAVITY, URINE: 1.03 (ref 1.005–1.030)
pH: 7.5 (ref 5.0–8.0)

## 2015-08-22 LAB — CBC
HEMATOCRIT: 54 % — AB (ref 39.0–52.0)
HEMOGLOBIN: 17.1 g/dL — AB (ref 13.0–17.0)
MCH: 30.1 pg (ref 26.0–34.0)
MCHC: 31.7 g/dL (ref 30.0–36.0)
MCV: 95.1 fL (ref 78.0–100.0)
Platelets: 168 10*3/uL (ref 150–400)
RBC: 5.68 MIL/uL (ref 4.22–5.81)
RDW: 13.5 % (ref 11.5–15.5)
WBC: 12.2 10*3/uL — ABNORMAL HIGH (ref 4.0–10.5)

## 2015-08-22 LAB — GLUCOSE, CAPILLARY
GLUCOSE-CAPILLARY: 168 mg/dL — AB (ref 65–99)
GLUCOSE-CAPILLARY: 144 mg/dL — AB (ref 65–99)
GLUCOSE-CAPILLARY: 129 mg/dL — AB (ref 65–99)
Glucose-Capillary: 226 mg/dL — ABNORMAL HIGH (ref 65–99)
Glucose-Capillary: 243 mg/dL — ABNORMAL HIGH (ref 65–99)
Glucose-Capillary: 135 mg/dL — ABNORMAL HIGH (ref 65–99)

## 2015-08-22 LAB — BLOOD GAS, ARTERIAL
ACID-BASE EXCESS: 8.9 mmol/L — AB (ref 0.0–2.0)
Bicarbonate: 32.5 mEq/L — ABNORMAL HIGH (ref 20.0–24.0)
DRAWN BY: 365271
FIO2: 0.8
LHR: 20 {breaths}/min
MECHVT: 620 mL
O2 SAT: 96 %
PEEP/CPAP: 5 cmH2O
PH ART: 7.507 — AB (ref 7.350–7.450)
PO2 ART: 74 mmHg — AB (ref 80.0–100.0)
Patient temperature: 99
TCO2: 33.7 mmol/L (ref 0–100)
pCO2 arterial: 41.3 mmHg (ref 35.0–45.0)

## 2015-08-22 LAB — URINE MICROSCOPIC-ADD ON
Bacteria, UA: NONE SEEN
SQUAMOUS EPITHELIAL / LPF: NONE SEEN

## 2015-08-22 LAB — STREP PNEUMONIAE URINARY ANTIGEN: Strep Pneumo Urinary Antigen: NEGATIVE

## 2015-08-22 LAB — PHOSPHORUS: Phosphorus: 2.9 mg/dL (ref 2.5–4.6)

## 2015-08-22 LAB — MAGNESIUM: Magnesium: 1.9 mg/dL (ref 1.7–2.4)

## 2015-08-22 LAB — PROCALCITONIN: Procalcitonin: <0.1 ng/mL

## 2015-08-22 MED ORDER — PREDNISONE 5 MG/ML PO CONC
60.0000 mg | Freq: Every day | ORAL | Status: DC
  Administered 2015-08-23 – 2015-08-24 (×2): 60 mg
  Filled 2015-08-22 (×2): qty 12

## 2015-08-22 MED ORDER — CLONIDINE HCL 0.1 MG PO TABS
0.1000 mg | ORAL_TABLET | Freq: Four times a day (QID) | ORAL | Status: DC
  Administered 2015-08-22 – 2015-08-24 (×9): 0.1 mg
  Filled 2015-08-22 (×10): qty 1

## 2015-08-22 MED ORDER — INSULIN ASPART 100 UNIT/ML ~~LOC~~ SOLN
0.0000 [IU] | SUBCUTANEOUS | Status: DC
  Administered 2015-08-22 (×2): 2 [IU] via SUBCUTANEOUS
  Administered 2015-08-22: 3 [IU] via SUBCUTANEOUS
  Administered 2015-08-22 (×2): 2 [IU] via SUBCUTANEOUS
  Administered 2015-08-23: 3 [IU] via SUBCUTANEOUS
  Administered 2015-08-23: 2 [IU] via SUBCUTANEOUS
  Administered 2015-08-25: 3 [IU] via SUBCUTANEOUS

## 2015-08-22 MED ORDER — SODIUM PHOSPHATES 45 MMOLE/15ML IV SOLN
30.0000 mmol | Freq: Once | INTRAVENOUS | Status: AC
  Administered 2015-08-22: 30 mmol via INTRAVENOUS
  Filled 2015-08-22: qty 10

## 2015-08-22 MED ORDER — POTASSIUM CHLORIDE 20 MEQ/15ML (10%) PO SOLN
40.0000 meq | Freq: Once | ORAL | Status: AC
Start: 2015-08-22 — End: 2015-08-22
  Administered 2015-08-22: 40 meq
  Filled 2015-08-22: qty 30

## 2015-08-22 MED ORDER — POTASSIUM CHLORIDE 20 MEQ/15ML (10%) PO SOLN
40.0000 meq | Freq: Once | ORAL | Status: AC
  Administered 2015-08-22: 40 meq
  Filled 2015-08-22: qty 30

## 2015-08-22 MED ORDER — INSULIN ASPART 100 UNIT/ML ~~LOC~~ SOLN
0.0000 [IU] | SUBCUTANEOUS | Status: DC
  Administered 2015-08-22 (×2): 3 [IU] via SUBCUTANEOUS

## 2015-08-22 NOTE — Progress Notes (Signed)
eLink Physician-Brief Progress Note Patient Name: Zachery ConchJames Ferrin DOB: May 20, 1929 MRN: [id number]   Date of Service  08/22/2015  HPI/Events of Note  Hypertension - BP = 198/62.  eICU Interventions  Will increase Catapres 0.1 mg to Q 6 hours.     Intervention Category Major Interventions: Hypertension - evaluation and management  Sommer,Steven Eugene 08/22/2015, 3:18 AM

## 2015-08-22 NOTE — Progress Notes (Signed)
eLink Physician-Brief Progress Note Patient Name: Sean Arellano DOB: December 03, 1929 MRN: [id number]   Date of Service  08/22/2015  HPI/Events of Note  Multiple issues: 1. K+ = 3.2 and Creatinine = 1.11, 2. PO4--- < 1.0 and 3. Blood glucose = 226.  eICU Interventions  Will order: 1. Repleate K+ and PO4--- 2. BMO and Phosphate level at 1 PM. 3. Q 4 hour sensitive Novolog SSI coverage.     Intervention Category Intermediate Interventions: Hyperglycemia - evaluation and treatment;Electrolyte abnormality - evaluation and management  Serina Nichter Eugene 08/22/2015, 4:42 AM

## 2015-08-22 NOTE — Progress Notes (Signed)
PULMONARY / CRITICAL CARE MEDICINE   Name: Sean Arellano MRN: [id number] DOB: 20-Dec-1929    ADMISSION DATE:  08/20/2015 CONSULTATION DATE:  08/20/15  REFERRING MD:  Dr. Patria Maneampos EDP  CHIEF COMPLAINT:  AMS  HISTORY OF PRESENT ILLNESS:   82 year old male with PMH as below, which is significant for COPD and [facility] for [facility]carbic [facility]. [facility] son who reports altered mental status for 12 hours prior to arrival. Per son this is a very similar presentation to [facility]. Patient denies any significant cough, fevers, chills, or chest pain. Venous blood gas in the emergency department with pH 7.29 and CO2 95. He was placed on BiPAP, however, blood gas only worsened. CXR with no evidence of acute process. Due to lack of improvement on BiPAP, PCCM has been asked to admit.  SUBJECTIVE:  Pt became agitated while drawing blood gas and then calmed down with his Precedex was increased. O2 was increased from 60-80% yesterday. Pt nods and shakes head in response to questions this morning. Denies chest pain.  REVIEW OF SYSTEMS:  Unable to obtain as patient is intubated.  VITAL SIGNS: BP 178/85 mmHg  Pulse 83  Temp(Src) 99.2 F (37.3 C) (Oral)  Resp 20  Ht 6' (1.829 m)  Wt 109.4 kg (241 lb 2.9 oz)  BMI 32.70 kg/m2  SpO2 97%  HEMODYNAMICS:    VENTILATOR SETTINGS: Vent Mode:  [-] PRVC FiO2 (%):  [60 %-80 %] 80 % Set Rate:  [20 bmp] 20 bmp Vt Set:  [130[620 mL] 620 mL PEEP:  [5 cmH20] 5 cmH20 Plateau Pressure:  [10 cmH20-19 cmH20] 16 cmH20  INTAKE / OUTPUT: I/O last 3 completed shifts: In: 3061.4 [I.V.:1792.1; NG/GT:976.3; IV Piggyback:293] Out: 1420 [Urine:1420]  PHYSICAL EXAMINATION: General: Laying in bed, in NAD Neuro: Alert, oriented, non-focal HEENT: [place]/AT, PERRL, no JVD Cardiovascular: RRR, no MRG Lungs: Coarse breath sounds but overall clear Abdomen: Soft, non-tender, non-distended Musculoskeletal: No  acute deformity  Skin: Grossly intact  LABS:  BMET  Recent Labs Lab 08/20/15 1633 08/21/15 0536 08/22/15 0248  NA 146* 143 141  K 3.9 4.4 3.2*  CL 99* 99* 102  CO2 40* 35* 30  BUN 11 14 25*  CREATININE 0.79 0.67 1.11  GLUCOSE 109* 132* 175*    Electrolytes  Recent Labs Lab 08/20/15 1633 08/21/15 0536 08/22/15 0248  CALCIUM 9.6 9.4 9.5  MG  --   --  1.9  PHOS  --   --  <1.0*    CBC  Recent Labs Lab 08/20/15 1633 08/21/15 0536 08/22/15 0248  WBC 6.2 5.7 12.2*  HGB 16.6 16.8 17.1*  HCT 55.6* 56.3* 54.0*  PLT 162 144* 168    Coag's No results for input(s): APTT, INR in the last 168 hours.  Sepsis Markers  Recent Labs Lab 08/20/15 1851  LATICACIDVEN 0.6    ABG  Recent Labs Lab 08/21/15 0357 08/21/15 1455 08/22/15 0304  PHART 7.220* 7.547* 7.507*  PCO2ART UNREPORTABLE RANGE 39.4 41.3  PO2ART 80.5 60.7* 74.0*    Liver Enzymes  Recent Labs Lab 08/20/15 1851  AST 24  ALT 20  ALKPHOS 44  BILITOT 0.5  ALBUMIN 3.8    Cardiac Enzymes  Recent Labs Lab 08/21/15 0240 08/21/15 0730 08/21/15 1252  TROPONINI 0.06* 0.06* 0.03*    Glucose  Recent Labs Lab 08/21/15 0205 08/21/15 1248 08/21/15 1556 08/21/15 2004 08/21/15 2319 08/22/15 0406  GLUCAP 116* 182* 178*  181* 173* 226*    Imaging Dg Chest Port 1 View  08/22/2015  CLINICAL DATA:  Intubation. EXAM: PORTABLE CHEST 1 VIEW COMPARISON:  08/21/2015. FINDINGS: Endotracheal tube, NG tube in stable position. Stable cardiomegaly. Developing mild bibasilar atelectasis and/or infiltrates. Small left pleural effusion. No pneumothorax. IMPRESSION: 1. Lines and tubes in stable position. 2. Developing bibasilar atelectasis and/or infiltrates. 3. Stable cardiomegaly. Electronically Signed   By: Maisie Fushomas  Register   On: 08/22/2015 06:48   Dg Chest Port 1 View  08/21/2015  CLINICAL DATA:  Status post endotracheal intubation EXAM: PORTABLE CHEST 1 VIEW COMPARISON:  08/20/2015 FINDINGS: Cardiac  shadow is mildly enlarged but stable. A nasogastric catheter and endotracheal tube in place. The endotracheal tube is 3 cm above the carina in satisfactory position. Some very minimal atelectasis is noted along the right minor fissure. The lungs are otherwise clear. No bony abnormality is seen. IMPRESSION: Tubes and lines as described above. Minimal right midlung atelectatic changes. Electronically Signed   By: Alcide CleverMark  Lukens M.D.   On: 08/21/2015 08:31   Dg Abd Portable 1v  08/21/2015  CLINICAL DATA:  Status post nasogastric catheter placement EXAM: PORTABLE ABDOMEN - 1 VIEW COMPARISON:  None. FINDINGS: Scattered large and small bowel gas is noted. A nasogastric catheter is seen and coiled within what may represent a hiatal hernia to the right of the midline. No other focal abnormality is noted. IMPRESSION: Likely hiatal hernia with the distal aspect of the nasogastric catheter within the herniated portion of the stomach. Electronically Signed   By: Alcide CleverMark  Lukens M.D.   On: 08/21/2015 08:33    STUDIES:  Port CXR 7/5: cardiomegaly, no acute disease Port CXR 7/6: minimal right midlung atelectasis Port CXR 7/7: developing bibasilar atelectasis and/or infiltrates  MICROBIOLOGY: MRSA PCR 7/5:  Negative Blood Ctx x2 7/6>> Urine Strep Ag 7/7:  Negative Urine Legionella Ag 7/7>> Respiratory Panel PCR 7/7:  Negative  Tracheal Asp Ctx 7/7>>  ANTIBIOTICS: Levaquin 7/6>>  SIGNIFICANT EVENTS: 7/5 - Admitted to ICU on BiPAP 7/6 - Intubated for respiratory insufficiency  LINES/TUBES: OETT 7.5 7/6>> OGT 7/6>> FOLEY 7/6>> PIV x2  DISCUSSION: 82 year old male with PMH of COPD, HTN, HLD, and tobacco abuse, who presented with AMS. Per son this is a very similar presentation [facility]. Patient denies any significant cough, fevers, chills, or chest pain. Venous blood gas in the emergency department with pH 7.29 and CO2 95. He was placed on BiPAP, however, blood gas only worsened. CXR  with no evidence of acute process. Due to lack of improvement on BiPAP, PCCM was asked to admit. His respiratory status worsened and he was intubated on 7/6.  ASSESSMENT / PLAN:  PULMONARY A: Acute on Chronic Hypercarbic Respiratory Failure - Secondary to COPD Exacerbation. Acute on Chronic Hypoxic Respiratory Failure Ongoing Tobacco Use  P:  Full Vent Support Weaning FiO2 for Sat 88-94% to minimize VQ mismatching Daily SBT & WUA as indicated Continuing Duonebs q6hr Transition to Prednisone 60mg  VT tomorrow AM D/C Solu-Medrol Holding home Symbicort Tobacco cessation education prior to discharge  CARDIOVASCULAR A:  Elevated Troponin I - Likely demand ischemia w/ hypoxia. H/O HTN H/O Hyperlipidemia  P:  Continue telemetry monitoring Vitals per unit protocol  Catapres 0.1mg  q6hrs Restart home Norvasc Holding home fenofibrate, metoprolol  Checking TTE  RENAL A:  Hypokalemia - Replaced. Hypophosphatemia - Replaced.  P:  Trending UOP with Foley Trending electrolytes & renal function daily Replace electrolytes as needed KCl 40mEq  VT x2 NaPhos IV  GASTROINTESTINAL A:  No acute issues.  P:  Pepcid IV bid Continue tube feeds  HEMATOLOGIC A:  Leukocytosis- Likely secondary to steroids. Polycythemia - Likely due to chronic hypoxia.  P:  Trending cell counts daily w/ CBC Lovenox [place] daily SCDs  INFECTIOUS A:  Possible Acute Bronchitis vs CAP  P:  Levaquin Day #2 Blood cultures pending Procalcitonin per algorithm  Respiratory viral panel Urinary legionella and strep pneumo Tracheal aspirate culture  ENDOCRINE A:  Hyperglycemia - Likely secondary to steroids.  P:  Accu-Checks q4hr SSI per Moderate Algorithm Hgb A1c pending  NEUROLOGIC A:  Acute Encephalopathy - Improving. Likely multifactorial from hypercarbia, hypoxia, & toxic. Sedation on Ventilator  P:  RASs goal: 0 to -1. Fentanyl IV prn Precedex  gtt  Willadean Carol, MD Family Medicine, PGY-2 08/22/2015, 7:30 AM  PCCM Attending Note: Patient seen and examined with resident physician. Please refer to her progress note which I reviewed in detail. Patient with acute on chronic hypoxic and hypercarbic respiratory failure. Clinically improving. Mental status is also improving significantly. Tolerating Precedex infusion. Replacing potassium and phosphorus accordingly. Continuing to wean ventilator support and hopefully will be able to perform spontaneous breathing trial in the morning.  I spent a total of 32 minutes of critical care time today caring for the patient and reviewing the patient's electronic medical record.  Donna Christen Jamison Neighbor, M.D. [facility] Pulmonary & Critical Care Pager:  [phone] After 3pm or if no response, call [phone] 3:43 PM 08/22/2015

## 2015-08-22 NOTE — Care Management Note (Signed)
Case Management Note  Patient Details  Name: Sean Arellano MRN: [id number] Date of Birth: [birth year]  Subjective/Objective:     Pt admitted with weakness and altered mental status - ventilated              Action/Plan:   CM will continue to monitor    Expected Discharge Date:                  Expected Discharge Plan:     In-House Referral:     Discharge planning Services  CM Consult  Post Acute Care Choice:    Choice offered to:     DME Arranged:    DME Agency:     HH Arranged:    HH Agency:     Status of Service:  In process, will continue to follow  If discussed at Long Length of Stay Meetings, dates discussed:    Additional Comments:  Cherylann ParrClaxton, Anais Denslow S, RN 08/22/2015, 11:14 AM

## 2015-08-23 ENCOUNTER — Inpatient Hospital Stay (HOSPITAL_COMMUNITY): Payer: Medicare Other

## 2015-08-23 DIAGNOSIS — R7989 Other specified abnormal findings of blood chemistry: Secondary | ICD-10-CM

## 2015-08-23 LAB — GLUCOSE, CAPILLARY
GLUCOSE-CAPILLARY: 128 mg/dL — AB (ref 65–99)
GLUCOSE-CAPILLARY: 83 mg/dL (ref 65–99)
Glucose-Capillary: 118 mg/dL — ABNORMAL HIGH (ref 65–99)
Glucose-Capillary: 165 mg/dL — ABNORMAL HIGH (ref 65–99)
Glucose-Capillary: 133 mg/dL — ABNORMAL HIGH (ref 65–99)
Glucose-Capillary: 99 mg/dL (ref 65–99)

## 2015-08-23 LAB — RENAL FUNCTION PANEL
ALBUMIN: 3 g/dL — AB (ref 3.5–5.0)
Anion gap: 7 (ref 5–15)
BUN: 24 mg/dL — AB (ref 6–20)
CALCIUM: 9.3 mg/dL (ref 8.9–10.3)
CHLORIDE: 110 mmol/L (ref 101–111)
CO2: 26 mmol/L (ref 22–32)
CREATININE: 0.64 mg/dL (ref 0.61–1.24)
GFR calc Af Amer: >60 mL/min (ref >60)
Glucose, Bld: 120 mg/dL — ABNORMAL HIGH (ref 65–99)
PHOSPHORUS: 3.4 mg/dL (ref 2.5–4.6)
Potassium: 3.5 mmol/L (ref 3.5–5.1)
SODIUM: 143 mmol/L (ref 135–145)

## 2015-08-23 LAB — CBC WITH DIFFERENTIAL/PLATELET
BASOS ABS: 0 10*3/uL (ref 0.0–0.1)
BASOS PCT: 0 %
EOS PCT: 0 %
Eosinophils Absolute: 0 10*3/uL (ref 0.0–0.7)
HCT: 52.9 % — ABNORMAL HIGH (ref 39.0–52.0)
Hemoglobin: 17.1 g/dL — ABNORMAL HIGH (ref 13.0–17.0)
Lymphocytes Relative: 13 %
Lymphs Abs: 1.7 10*3/uL (ref 0.7–4.0)
MCH: 30.3 pg (ref 26.0–34.0)
MCHC: 32.3 g/dL (ref 30.0–36.0)
MCV: 93.6 fL (ref 78.0–100.0)
MONO ABS: 1.4 10*3/uL — AB (ref 0.1–1.0)
Monocytes Relative: 10 %
Neutro Abs: 10.1 10*3/uL — ABNORMAL HIGH (ref 1.7–7.7)
Neutrophils Relative %: 77 %
PLATELETS: 148 10*3/uL — AB (ref 150–400)
RBC: 5.65 MIL/uL (ref 4.22–5.81)
RDW: 14.5 % (ref 11.5–15.5)
WBC: 13.1 10*3/uL — AB (ref 4.0–10.5)

## 2015-08-23 LAB — HEMOGLOBIN A1C
HEMOGLOBIN A1C: 5.6 % (ref 4.8–5.6)
Mean Plasma Glucose: 114 mg/dL

## 2015-08-23 LAB — MAGNESIUM: MAGNESIUM: 1.8 mg/dL (ref 1.7–2.4)

## 2015-08-23 LAB — ECHOCARDIOGRAM LIMITED
HEIGHTINCHES: 72 in
Weight: 3746.06 oz

## 2015-08-23 LAB — PROCALCITONIN

## 2015-08-23 MED ORDER — AMLODIPINE BESYLATE 10 MG PO TABS
10.0000 mg | ORAL_TABLET | Freq: Every day | ORAL | Status: DC
  Administered 2015-08-23 – 2015-08-24 (×2): 10 mg
  Filled 2015-08-23 (×2): qty 1

## 2015-08-23 MED ORDER — WHITE PETROLATUM GEL
Status: AC
  Administered 2015-08-23: 0.2
  Filled 2015-08-23: qty 1

## 2015-08-23 MED ORDER — ONDANSETRON HCL 4 MG/2ML IJ SOLN
4.0000 mg | Freq: Four times a day (QID) | INTRAMUSCULAR | Status: DC | PRN
  Administered 2015-08-23: 4 mg via INTRAVENOUS
  Filled 2015-08-23 (×2): qty 2

## 2015-08-23 MED ORDER — AMLODIPINE 1 MG/ML ORAL SUSPENSION
10.0000 mg | Freq: Every day | ORAL | Status: DC
  Filled 2015-08-23: qty 10

## 2015-08-23 NOTE — Procedures (Signed)
Extubation Procedure Note  Patient Details:   Name: Zachery ConchJames Bezio DOB: 10-20-1929 MRN: [id number]   Airway Documentation:     Evaluation  O2 sats: stable throughout Complications: No apparent complications Patient did tolerate procedure well. Bilateral Breath Sounds: Clear, Diminished   Yes   Pt extubated to 4L N/C, no stridor noted.  RN at bedside.  Christophe LouisSteven D Xiao Graul 08/23/2015, 12:17 PM

## 2015-08-23 NOTE — Progress Notes (Signed)
PCCM ATTENDING: Patient seen at end of spontaneous breathing trial. O2 saturations stable at approximately 91% on FiO2 0.4. Normal work of breathing. Hemodynamically stable. Plan to extubate if cuff leak is present.  Sean Arellano, M.D. [facility] Pulmonary & Critical Care Pager:  [phone] After 3pm or if no response, call [phone] 12:19 PM 08/23/2015

## 2015-08-23 NOTE — Progress Notes (Signed)
PULMONARY / CRITICAL CARE MEDICINE   Name: Sean Arellano MRN: [id number] DOB: [birth year]    ADMISSION DATE:  08/20/2015 CONSULTATION DATE:  08/20/15  REFERRING MD:  Dr. Patria Mane EDP  CHIEF COMPLAINT:  AMS  HISTORY OF PRESENT ILLNESS:   82 year old male with PMH as below, which is significant for COPD and recent admission to [facility] for hypercarbic respiratory failure. He presented to [facility] 7/[facility] with son who reports altered mental status for 12 hours prior to arrival. Per son this is a very similar presentation to when he was admitted to [facility]. Patient denies any significant cough, fevers, chills, or chest pain. Venous blood gas in the emergency department with pH 7.29 and CO2 95. He was placed on BiPAP, however, blood gas only worsened. CXR with no evidence of acute process. Due to lack of improvement on BiPAP, PCCM has been asked to admit.  SUBJECTIVE: No acute events overnight. Patient had mild agitation. Patient reporting some nausea this AM but nods no to any difficulty breathing.   REVIEW OF SYSTEMS:  Unable to obtain as patient is intubated.  VITAL SIGNS: BP 171/94 mmHg  Pulse 86  Temp(Src) 98.6 F (37 C) (Oral)  Resp 20  Ht 6' (1.829 m)  Wt 234 lb 2.1 oz (106.2 kg)  BMI 31.75 kg/m2  SpO2 96%  HEMODYNAMICS:    VENTILATOR SETTINGS: Vent Mode:  [-] PRVC FiO2 (%):  [60 %-80 %] 60 % Set Rate:  [20 bmp] 20 bmp Vt Set:  [409 mL] 620 mL PEEP:  [5 cmH20] 5 cmH20 Plateau Pressure:  [14 cmH20-19 cmH20] 14 cmH20  INTAKE / OUTPUT: I/O last 3 completed shifts: In: 5299 [I.V.:2566; NG/GT:2125; IV Piggyback:608] Out: 4200 [Urine:4200]  PHYSICAL EXAMINATION: General: Laying in bed. No distress. Eyes closed but opens them easily. Neuro: Sleeping until awoken. Moving all extremities. Nods to questions & follows commands. HEENT: ETT in place. No scleral icterus or injection. Cardiovascular: Regular rate. No edema. No appreciable JVD. Lungs: Clear bilaterally with auscultation.  Symmetric chest rise on ventilator. Abdomen: Soft. Protuberant. Hypoactive bowel sounds. Musculoskeletal: No acute joint deformity or effusion. Skin: Warm & dry. No rash on exposed skin.  LABS:  BMET  Recent Labs Lab 08/22/15 0248 08/22/15 1002 08/23/15 0548  NA 141 143 143  K 3.2* 3.2* 3.5  CL 102 104 110  CO2 BUN 25* 27* 24*  CREATININE 1.11 0.93 0.64  GLUCOSE 175* 172* 120*    Electrolytes  Recent Labs Lab 08/22/15 0248 08/22/15 1002 08/23/15 0548  CALCIUM 9.5 9.2 9.3  MG 1.9  --  1.8  PHOS <1.0* 2.9 3.4    CBC  Recent Labs Lab 08/21/15 0536 08/22/15 0248 08/23/15 0548  WBC 5.7 12.2* 13.1*  HGB 16.8 17.1* 17.1*  HCT 56.3* 54.0* 52.9*  PLT 144* 168 148*    Coag's No results for input(s): APTT, INR in the last 168 hours.  Sepsis Markers  Recent Labs Lab 08/20/15 1851 08/22/15 1002  LATICACIDVEN 0.6  --   PROCALCITON  --  <0.10    ABG  Recent Labs Lab 08/21/15 0357 08/21/15 1455 08/22/15 0304  PHART 7.220* 7.547* 7.507*  PCO2ART UNREPORTABLE RANGE 39.4 41.3  PO2ART 80.5 60.7* 74.0*    Liver Enzymes  Recent Labs Lab 08/20/15 1851 08/23/15 0548  AST 24  --   ALT 20  --   ALKPHOS 44  --   BILITOT 0.5  --   ALBUMIN 3.8 3.0*    Cardiac  Enzymes  Recent Labs Lab 08/21/15 0240 08/21/15 0730 08/21/15 1252  TROPONINI 0.06* 0.06* 0.03*    Glucose  Recent Labs Lab 08/22/15 1016 08/22/15 1125 08/22/15 1541 08/22/15 1945 08/22/15 2340 08/23/15 0332  GLUCAP 168* 144* 135* 129* 128* 118*    Imaging No results found.  STUDIES:  Port CXR 7/5: cardiomegaly, no acute disease Port CXR 7/6: minimal right midlung atelectasis Port CXR 7/7: developing bibasilar atelectasis and/or infiltrates  MICROBIOLOGY: MRSA PCR 7/5:  Negative Blood Ctx x2 7/6>> Urine Strep Ag 7/7:  Negative Urine Legionella Ag 7/7>> Respiratory Panel PCR 7/7:  Negative  Tracheal Asp Ctx 7/7>>  ANTIBIOTICS: Levaquin  7/6>>  SIGNIFICANT EVENTS: 7/5 - Admitted to ICU on BiPAP 7/6 - Intubated for respiratory insufficiency  LINES/TUBES: OETT 7.5 7/6>> OGT 7/6>> FOLEY 7/6>> PIV x2  ASSESSMENT / PLAN:  PULMONARY A: Acute on Chronic Hypercarbic Respiratory Failure - Secondary to COPD Exacerbation. Acute on Chronic Hypoxic Respiratory Failure - Improving. Ongoing Tobacco Use  P:  Full Vent Support Weaning FiO2 for Sat 88-94% to minimize VQ mismatching Plan for SBT this AM Continuing Duonebs q6hr Prednisone 60mg  VT daily Day #1 Holding home Symbicort Tobacco cessation education prior to discharge  CARDIOVASCULAR A:  Elevated Troponin I - Likely demand ischemia w/ hypoxia. H/O HTN H/O Hyperlipidemia  P:  Continue telemetry monitoring Vitals per unit protocol  Catapres 0.1mg  q6hrs Restart home Norvasc 10mg  today Holding home fenofibrate, metoprolol  TTE pending  RENAL A:  Hypokalemia - Resolved. Hypophosphatemia - Resolved.  P:  Trending UOP with Foley Trending electrolytes & renal function daily Replace electrolytes as needed  GASTROINTESTINAL A:  Nausea - No BM.  P:  Pepcid IV bid Holding tube feedings Zofran IV prn  HEMATOLOGIC A:  Leukocytosis- Likely secondary to steroids. Stable. Polycythemia - Likely due to chronic hypoxia. Thrombocytopenia - Mild. Stable.  P:  Trending cell counts daily w/ CBC Lovenox [place] daily SCDs  INFECTIOUS A:  Possible Acute Bronchitis vs CAP  P:  Levaquin Day #3 Blood cultures pending Procalcitonin per algorithm  Respiratory viral panel Urinary legionella and strep pneumo Tracheal aspirate culture  ENDOCRINE A:  Hyperglycemia - Likely secondary to steroids. Controlled.  P:  Accu-Checks q4hr SSI per Moderate Algorithm Hgb A1c pending  NEUROLOGIC A:  Acute Encephalopathy - Improving. Likely multifactorial from hypercarbia, hypoxia, & toxic. Sedation on Ventilator  P:  RASS goal: 0 to  -1. Fentanyl IV prn Wean Precedex gtt  FAMILY UPDATE:  No family at bedside 7/8.  TODAY'S SUMMARY:  82 year old male with PMH of COPD, HTN, HLD, and tobacco abuse, who presented with AMS. Improving neurological status. Continuing to tolerate FiO2 wean. Plan for SBT this AM on PS 0/5 FiO2 0.4. Discussed with RT & RN at bedside.   I spent a total of 34 minutes of critical care time today caring for the patient and reviewing the patient's electronic medical record.  Donna ChristenJennings E. Jamison NeighborNestor, M.D. [facility] Pulmonary & Critical Care Pager:  [phone] After 3pm or if no response, call [phone] 7:09 AM 08/23/2015

## 2015-08-23 NOTE — Progress Notes (Signed)
  Echocardiogram 2D Echocardiogram Limited has been performed.  Leta JunglingCooper, Chrisandra Wiemers M 08/23/2015, 9:46 AM

## 2015-08-24 ENCOUNTER — Inpatient Hospital Stay (HOSPITAL_COMMUNITY): Payer: Medicare Other

## 2015-08-24 DIAGNOSIS — R410 Disorientation, unspecified: Secondary | ICD-10-CM

## 2015-08-24 DIAGNOSIS — E876 Hypokalemia: Secondary | ICD-10-CM

## 2015-08-24 DIAGNOSIS — J9600 Acute respiratory failure, unspecified whether with hypoxia or hypercapnia: Secondary | ICD-10-CM

## 2015-08-24 LAB — POCT I-STAT 3, ART BLOOD GAS (G3+)
Acid-Base Excess: 4 mmol/L — ABNORMAL HIGH (ref 0.0–2.0)
Bicarbonate: 31.1 mEq/L — ABNORMAL HIGH (ref 20.0–24.0)
O2 Saturation: 92 %
PCO2 ART: 58.1 mmHg — AB (ref 35.0–45.0)
PO2 ART: 69 mmHg — AB (ref 80.0–100.0)
Patient temperature: 98.6
TCO2: 33 mmol/L (ref 0–100)
pH, Arterial: 7.337 — ABNORMAL LOW (ref 7.350–7.450)

## 2015-08-24 LAB — BASIC METABOLIC PANEL
ANION GAP: 7 (ref 5–15)
BUN: 20 mg/dL (ref 6–20)
CALCIUM: 9.2 mg/dL (ref 8.9–10.3)
CO2: 30 mmol/L (ref 22–32)
Chloride: 107 mmol/L (ref 101–111)
Creatinine, Ser: 0.79 mg/dL (ref 0.61–1.24)
GFR calc Af Amer: >60 mL/min (ref >60)
GLUCOSE: 82 mg/dL (ref 65–99)
Potassium: 3.4 mmol/L — ABNORMAL LOW (ref 3.5–5.1)
SODIUM: 144 mmol/L (ref 135–145)

## 2015-08-24 LAB — GLUCOSE, CAPILLARY
GLUCOSE-CAPILLARY: 81 mg/dL (ref 65–99)
GLUCOSE-CAPILLARY: 94 mg/dL (ref 65–99)
Glucose-Capillary: 110 mg/dL — ABNORMAL HIGH (ref 65–99)
Glucose-Capillary: 100 mg/dL — ABNORMAL HIGH (ref 65–99)
Glucose-Capillary: 94 mg/dL (ref 65–99)
Glucose-Capillary: 125 mg/dL — ABNORMAL HIGH (ref 65–99)

## 2015-08-24 LAB — RENAL FUNCTION PANEL
ANION GAP: 6 (ref 5–15)
Albumin: 3.1 g/dL — ABNORMAL LOW (ref 3.5–5.0)
BUN: 21 mg/dL — ABNORMAL HIGH (ref 6–20)
CHLORIDE: 107 mmol/L (ref 101–111)
CO2: 29 mmol/L (ref 22–32)
CREATININE: 0.8 mg/dL (ref 0.61–1.24)
Calcium: 9.1 mg/dL (ref 8.9–10.3)
Glucose, Bld: 89 mg/dL (ref 65–99)
Phosphorus: 3.2 mg/dL (ref 2.5–4.6)
Potassium: 3.3 mmol/L — ABNORMAL LOW (ref 3.5–5.1)
Sodium: 142 mmol/L (ref 135–145)

## 2015-08-24 LAB — CBC WITH DIFFERENTIAL/PLATELET
BASOS ABS: 0 10*3/uL (ref 0.0–0.1)
Basophils Relative: 0 %
EOS PCT: 0 %
Eosinophils Absolute: 0 10*3/uL (ref 0.0–0.7)
HEMATOCRIT: 50.9 % (ref 39.0–52.0)
HEMOGLOBIN: 16.1 g/dL (ref 13.0–17.0)
LYMPHS ABS: 1.8 10*3/uL (ref 0.7–4.0)
LYMPHS PCT: 14 %
MCH: 30.3 pg (ref 26.0–34.0)
MCHC: 31.6 g/dL (ref 30.0–36.0)
MCV: 95.7 fL (ref 78.0–100.0)
Monocytes Absolute: 1.4 10*3/uL — ABNORMAL HIGH (ref 0.1–1.0)
Monocytes Relative: 11 %
NEUTROS ABS: 10 10*3/uL — AB (ref 1.7–7.7)
NEUTROS PCT: 75 %
PLATELETS: 143 10*3/uL — AB (ref 150–400)
RBC: 5.32 MIL/uL (ref 4.22–5.81)
RDW: 14.7 % (ref 11.5–15.5)
WBC: 13.2 10*3/uL — AB (ref 4.0–10.5)

## 2015-08-24 LAB — ECHOCARDIOGRAM COMPLETE
Height: 72 in
Weight: 3703.73 oz

## 2015-08-24 LAB — CULTURE, RESPIRATORY: SPECIAL REQUESTS: NORMAL

## 2015-08-24 LAB — PROCALCITONIN: Procalcitonin: <0.1 ng/mL

## 2015-08-24 LAB — CULTURE, RESPIRATORY W GRAM STAIN: Culture: NORMAL

## 2015-08-24 LAB — MAGNESIUM: MAGNESIUM: 1.9 mg/dL (ref 1.7–2.4)

## 2015-08-24 MED ORDER — ASPIRIN EC 81 MG PO TBEC
81.0000 mg | DELAYED_RELEASE_TABLET | Freq: Every day | ORAL | Status: DC
  Administered 2015-08-24 – 2015-08-29 (×6): 81 mg via ORAL
  Filled 2015-08-24 (×6): qty 1

## 2015-08-24 MED ORDER — AMLODIPINE BESYLATE 10 MG PO TABS
10.0000 mg | ORAL_TABLET | Freq: Every day | ORAL | Status: DC
  Administered 2015-08-25 – 2015-08-29 (×5): 10 mg via ORAL
  Filled 2015-08-24 (×5): qty 1

## 2015-08-24 MED ORDER — PREDNISONE 50 MG PO TABS
60.0000 mg | ORAL_TABLET | Freq: Every day | ORAL | Status: DC
  Administered 2015-08-25: 60 mg via ORAL
  Filled 2015-08-24 (×3): qty 1

## 2015-08-24 MED ORDER — HALOPERIDOL LACTATE 5 MG/ML IJ SOLN
2.0000 mg | Freq: Four times a day (QID) | INTRAMUSCULAR | Status: DC | PRN
  Administered 2015-08-24 – 2015-08-25 (×2): 2 mg via INTRAVENOUS
  Filled 2015-08-24 (×2): qty 1

## 2015-08-24 MED ORDER — POTASSIUM CHLORIDE 20 MEQ/15ML (10%) PO SOLN
20.0000 meq | ORAL | Status: AC
  Administered 2015-08-24 (×2): 20 meq
  Filled 2015-08-24 (×2): qty 15

## 2015-08-24 MED ORDER — NICOTINE 7 MG/24HR TD PT24
7.0000 mg | MEDICATED_PATCH | TRANSDERMAL | Status: DC
  Administered 2015-08-25 – 2015-08-28 (×4): 7 mg via TRANSDERMAL
  Filled 2015-08-24 (×7): qty 1

## 2015-08-24 MED ORDER — CLONIDINE HCL 0.1 MG PO TABS
0.1000 mg | ORAL_TABLET | Freq: Four times a day (QID) | ORAL | Status: DC
  Administered 2015-08-24 – 2015-08-29 (×20): 0.1 mg via ORAL
  Filled 2015-08-24 (×22): qty 1

## 2015-08-24 MED ORDER — PERFLUTREN LIPID MICROSPHERE
1.0000 mL | INTRAVENOUS | Status: AC | PRN
  Administered 2015-08-24: 3 mL via INTRAVENOUS
  Filled 2015-08-24: qty 10

## 2015-08-24 MED ORDER — HYDRALAZINE HCL 20 MG/ML IJ SOLN: 10.0000 mg | INTRAMUSCULAR | Status: DC | PRN

## 2015-08-24 MED ORDER — IPRATROPIUM-ALBUTEROL 0.5-2.5 (3) MG/3ML IN SOLN
3.0000 mL | Freq: Four times a day (QID) | RESPIRATORY_TRACT | Status: DC
  Administered 2015-08-24 (×3): 3 mL via RESPIRATORY_TRACT
  Filled 2015-08-24 (×4): qty 3

## 2015-08-24 NOTE — Progress Notes (Signed)
  Echocardiogram 2D Echocardiogram has been performed.  Arvil ChacoFoster, Deaglan Lile 08/24/2015, 11:58 AM

## 2015-08-24 NOTE — Progress Notes (Signed)
PULMONARY / CRITICAL CARE MEDICINE   Name: Sean Arellano MRN: [id number] DOB: 03-02-1929    ADMISSION DATE:  08/20/2015 CONSULTATION DATE:  08/20/15  REFERRING MD:  Dr. Patria Mane EDP  CHIEF COMPLAINT:  AMS  HISTORY OF PRESENT ILLNESS:   82 year old male with PMH as below, which is significant for COPD and recent admission to [facility] for hypercarbic respiratory failure. He presented to [facility] ED 7/5 with son who reports altered mental status for 12 hours prior to arrival. Per son this is a very similar presentation to when he was admitted to [facility]. Patient denies any significant cough, fevers, chills, or chest pain. Venous blood gas in the emergency department with pH 7.29 and CO2 95. He was placed on BiPAP, however, blood gas only worsened. CXR with no evidence of acute process. Due to lack of improvement on BiPAP, PCCM has been asked to admit.  SUBJECTIVE: No acute events overnight. Extubated yesterday. Patient reports nausea has resolved. Reports dyspnea improving. Denies any cough. Denies any abdominal pain or pressure. Patient denies any chest pain or pressure.  REVIEW OF SYSTEMS:  Patient with mild delirium therefore accurate review of systems cannot be obtained.  VITAL SIGNS: BP 149/73 mmHg  Pulse 50  Temp(Src) 98.6 F (37 C) (Oral)  Resp 20  Ht 6' (1.829 m)  Wt 231 lb 7.7 oz (105 kg)  BMI 31.39 kg/m2  SpO2 91%  HEMODYNAMICS:    VENTILATOR SETTINGS: Vent Mode:  [-] CPAP;PSV FiO2 (%):  [40 %] 40 % PEEP:  [5 cmH20] 5 cmH20 Pressure Support:  [5 cmH20] 5 cmH20  INTAKE / OUTPUT: I/O last 3 completed shifts: In: 3545.5 [I.V.:2235.5; NG/GT:960; IV Piggyback:350] Out: 5190 [Urine:5190]  PHYSICAL EXAMINATION: General: No distress. Son at bedside. Awake. Neuro: Grossly nonfocal. Cranial nerves grossly intact. Moving all 4 extremities equally. Oriented to person, year, & president. He believes he is at home however. HEENT: ETT in place. No scleral icterus or  injection. Cardiovascular: Tachycardic with regular rhythm. No edema. No appreciable JVD. Lungs: Improving aeration bilaterally. Mild coarse wheeze. Normal work of breathing on nasal cannula oxygen. Abdomen: Soft. Protuberant. Nontender. Musculoskeletal: No acute joint deformity or effusion. Skin: Warm & dry. No rash on exposed skin.  LABS:  BMET  Recent Labs Lab 08/23/15 0548 08/24/15 0224 08/24/15 0615  NA 143 142 144  K 3.5 3.3* 3.4*  CL 110 107 107  CO2 26 29 30   BUN 24* 21* 20  CREATININE 0.64 0.80 0.79  GLUCOSE 120* 89 82    Electrolytes  Recent Labs Lab 08/22/15 0248 08/22/15 1002 08/23/15 0548 08/24/15 0224 08/24/15 0615  CALCIUM 9.5 9.2 9.3 9.1 9.2  MG 1.9  --  1.8 1.9  --   PHOS <1.0* 2.9 3.4 3.2  --     CBC  Recent Labs Lab 08/22/15 0248 08/23/15 0548 08/24/15 0224  WBC 12.2* 13.1* 13.2*  HGB 17.1* 17.1* 16.1  HCT 54.0* 52.9* 50.9  PLT 168 148* 143*    Coag's No results for input(s): APTT, INR in the last 168 hours.  Sepsis Markers  Recent Labs Lab 08/20/15 1851 08/22/15 1002 08/23/15 0548 08/24/15 0224  LATICACIDVEN 0.6  --   --   --   PROCALCITON  --  <0.10 <0.10 <0.10    ABG  Recent Labs Lab 08/21/15 0357 08/21/15 1455 08/22/15 0304  PHART 7.220* 7.547* 7.507*  PCO2ART UNREPORTABLE RANGE 39.4 41.3  PO2ART 80.5 60.7* 74.0*    Liver Enzymes  Recent Labs Lab 08/20/15  1851 08/23/15 0548 08/24/15 0224  AST 24  --   --   ALT 20  --   --   ALKPHOS 44  --   --   BILITOT 0.5  --   --   ALBUMIN 3.8 3.0* 3.1*    Cardiac Enzymes  Recent Labs Lab 08/21/15 0240 08/21/15 0730 08/21/15 1252  TROPONINI 0.06* 0.06* 0.03*    Glucose  Recent Labs Lab 08/23/15 1212 08/23/15 1629 08/23/15 1952 08/24/15 0011 08/24/15 0354 08/24/15 0754  GLUCAP 133* 99 83 81 94 110*    Imaging No results found.  STUDIES:  Port CXR 7/5: cardiomegaly, no acute disease Port CXR 7/6: minimal right midlung atelectasis Port  CXR 7/7: developing bibasilar atelectasis and/or infiltrates TTE 7/8:  Echo unreadable>>reordered.  MICROBIOLOGY: MRSA PCR 7/5:  Negative Blood Ctx x2 7/6>> Urine Strep Ag 7/7:  Negative Urine Legionella Ag 7/7>> Respiratory Panel PCR 7/7:  Negative  Tracheal Asp Ctx 7/7>>  ANTIBIOTICS: Levaquin 7/6>>  SIGNIFICANT EVENTS: 7/5 - Admitted to ICU on BiPAP 7/6 - Intubated for respiratory insufficiency 7/8 - Extubated   LINES/TUBES: OETT 7.5 7/6 - 7/8 OGT 7/6 - 7/8 FOLEY 7/6>> PIV x1  ASSESSMENT / PLAN:  PULMONARY A: Acute on Chronic Hypercarbic Respiratory Failure - Secondary to COPD Exacerbation. Resolving. Acute on Chronic Hypoxic Respiratory Failure - Improving. Resolving. Ongoing Tobacco Use  P:  Weaning FiO2 for Sat 88-94% to minimize VQ mismatching Continuing Duonebs QID Albuterol neb prn Prednisone 60mg  daily Day #2 Incentive Spirometry for Pulmonary [place]ilette Holding home Symbicort Tobacco cessation education prior to discharge  CARDIOVASCULAR A:  Elevated Troponin I - Likely demand ischemia w/ hypoxia. Bradycardia - Mild. H/O HTN H/O Hyperlipidemia  P:  Continue telemetry monitoring Vitals per unit protocol  Catapres 0.1mg  q6hrs ASA 81mg  daily Home Norvasc 10mg  today Holding home fenofibrate & metoprolol  TTE reordered as initial unreadable Hydralazine IV prn SBP >165  RENAL A:  Hypokalemia - Replaced. Hypophosphatemia - Resolved.  P:  Trending UOP  D/C Foley Trending electrolytes & renal function daily Replace electrolytes as needed KCl 40mEq PO  GASTROINTESTINAL A:  Nausea - Resolved.  P:  NPO pending speech eval D/C Pepcid Zofran IV prn  HEMATOLOGIC A:  Leukocytosis- Likely secondary to steroids. Stable. Polycythemia - Likely due to chronic hypoxia. Thrombocytopenia - Mild. Stable.  P:  Trending cell counts daily w/ CBC Lovenox [place] daily SCDs  INFECTIOUS A:  Possible Acute Bronchitis vs CAP  P:   Levaquin Day #4 Cultures pending Procalcitonin per algorithm   ENDOCRINE A:  Hyperglycemia - Likely secondary to steroids. Controlled. Hgb A1c 5.6.  P:  Accu-Checks q4hr while NPO SSI per Moderate Algorithm  NEUROLOGIC A:  Acute Encephalopathy - Improving. Likely multifactorial from hypercarbia, hypoxia, & toxic.  P:  Monitor closely. No sedating medications. PT Consult Chair alarm for fall risk  FAMILY UPDATE:  Son updated at bedside 7/9 by Dr. Jamison NeighborNestor.  TODAY'S SUMMARY:  82 year old male with PMH of COPD, HTN, HLD, and tobacco abuse, who presented with AMS. Patient is ongoing delirium is likely multifactorial and continues to improve. His acute respiratory failure is also improving with decreasing oxygen requirement. Reassuring transthoracic echocardiogram given study performed yesterday was unreadable. Patient will need aggressive tobacco cessation education prior to discharge from hospital. Potassium was replaced today for hypokalemia. Awaiting speech therapy consult prior to initiating diet. Patient will remain on Accu-Cheks every 4 hours while he is nothing by mouth. Transferring to medical telemetry floor.  TRH  to assume care & PCCM signing off as of 7/10.  Donna Christen Jamison Neighbor, M.D. [facility] Pulmonary & Critical Care Pager:  [phone] After 3pm or if no response, call [phone] 9:18 AM 08/24/2015

## 2015-08-24 NOTE — Evaluation (Signed)
Clinical/Bedside Swallow Evaluation Patient Details  Name: Sean Arellano MRN: [id number] Date of Birth: 12-Jul-1929  Today's Date: 08/24/2015 Time: SLP Start Time (ACUTE ONLY): 0955 SLP Stop Time (ACUTE ONLY): 1009 SLP Time Calculation (min) (ACUTE ONLY): 14 min  Past Medical History:  Past Medical History  Diagnosis Date  . COPD (chronic obstructive pulmonary disease) (HCC)   . Hypertension   . High cholesterol   . Smoker     unsure of ppd    Past Surgical History: History reviewed. No pertinent past surgical history. HPI:  82 year old male with PMH as below, which is significant for COPD and recent admission to [facility] for hypercarbic respiratory failure. He presented to [facility] ED 7/5 with son who reports altered mental status for 12 hours prior to arrival. Per son this is a very similar presentation to when he was admitted to [facility]. Patient denies any significant cough, fevers, chills, or chest pain. Venous blood gas in the emergency department with pH 7.29 and CO2 95. He was placed on BiPAP, however, blood gas only worsened. CXR with no evidence of acute process initially but progressed to show infiltrates.  Intubated 7/6-extubated 7/8   Assessment / Plan / Recommendation Clinical Impression  Pt demonstrates normal swallow function with no observable signs of aspiration. He becomes easily hypoxic and tachycardic with activity, bur RR remains stable and airway protection appears adequate. Pts intake is slow and careful at baseline. Recommend pt initiate a regular diet and thin liquids. No SLP f/u needed at this time.     Aspiration Risk  Mild aspiration risk    Diet Recommendation Regular;Thin liquid   Liquid Administration via: Cup;Straw Medication Administration: Whole meds with liquid Supervision: Patient able to self feed Compensations: Slow rate Postural Changes: Seated upright at 90 degrees    Other  Recommendations     Follow up Recommendations  None      Swallow Study    General HPI: 82 year old male with PMH as below, which is significant for COPD and recent admission to [facility] for hypercarbic respiratory failure. He presented to [facility] ED 7/5 with son who reports altered mental status for 12 hours prior to arrival. Per son this is a very similar presentation to when he was admitted to [facility]. Patient denies any significant cough, fevers, chills, or chest pain. Venous blood gas in the emergency department with pH 7.29 and CO2 95. He was placed on BiPAP, however, blood gas only worsened. CXR with no evidence of acute process initially but progressed to show infiltrates.  Intubated 7/6-extubated 7/8 Type of Study: Bedside Swallow Evaluation Diet Prior to this Study: Regular;Thin liquids Temperature Spikes Noted: No Respiratory Status: Nasal cannula History of Recent Intubation: Yes Length of Intubations (days): 3 days Date extubated: 08/23/15 Behavior/Cognition: Alert;Cooperative Oral Cavity Assessment: Within Functional Limits Oral Care Completed by SLP: No Oral Cavity - Dentition: Dentures, top;Dentures, bottom Vision: Functional for self-feeding Self-Feeding Abilities: Able to feed self Patient Positioning: Upright in bed Baseline Vocal Quality: Hoarse (very mildly hoarse) Volitional Cough: Congested Volitional Swallow: Able to elicit    Oral/Motor/Sensory Function Overall Oral Motor/Sensory Function: Within functional limits   Ice Chips     Thin Liquid Thin Liquid: Within functional limits Presentation: Cup;Straw;Self Fed    Nectar Thick Nectar Thick Liquid: Not tested   Honey Thick Honey Thick Liquid: Not tested   Puree Puree: Within functional limits   Solid   GO   Solid: Within functional limits  Harlon DittyBonnie Kelena Garrow, MA CCC-SLP [phone]  Claudine MoutonDeBlois, Erianna Jolly Caroline 08/24/2015,10:14 AM

## 2015-08-24 NOTE — Progress Notes (Addendum)
PCCM Attending:  Nurse notified patient feeling more anxious and confused. Starting Haldol 2mg  IV q6hr prn & placing Nicotine patch 7mg Meredith Pel/24hr.  Natalin Bible E. Jamison NeighborNestor, M.D. [facility] Pager:  [phone] After 3pm or if no response, call [phone] 2:55 PM 08/24/2015

## 2015-08-24 NOTE — Progress Notes (Signed)
Pt was oriented x4 in the am assessment. Pt becomes progressively more confused, pleasantly at this time. Reorientation is provided each time. Pt states: "did not sleep for couple of days because of my nurses"  In am report it was stated that pt did not sleep all night. MD called and notified about the situation. See new orders

## 2015-08-24 NOTE — Progress Notes (Signed)
[facility] LPELINK ADULT ICU REPLACEMENT PROTOCOL FOR AM LAB REPLACEMENT ONLY  The patient does apply for the [facility] LPELINK Adult ICU Electrolyte Replacment Protocol based on the criteria listed below:   1. Is GFR >/= 40 ml/min? Yes.    Patient's GFR today is >60 2. Is urine output >/= 0.5 ml/kg/hr for the last 6 hours? Yes.   Patient's UOP is 0.9 ml/kg/hr 3. Is BUN < 60 mg/dL? Yes.    Patient's BUN today is 21 4. Abnormal electrolyte(s): K 3.3 5. Ordered repletion with: per protocol 6. If a panic level lab has been reported, has the CCM MD in charge been notified? No..   Physician:    Markus DaftWHELAN, Janye Maynor A 08/24/2015 3:55 AM

## 2015-08-24 NOTE — Progress Notes (Signed)
Pharmacy Antibiotic Note  Sean Arellano is a 82 y.o. male continues on day #4 levaquin for acute bronchitis vs CAP. Renal function stable. Now extubated, NPO pending swallow eval.  Plan: 1) Continue levaquin 500mg  IV q24 2) Consider changing to PO if passes swallow eval  Height: 6' (182.9 cm) Weight: 231 lb 7.7 oz (105 kg) IBW/kg (Calculated) : 77.6  Temp (24hrs), Avg:99.1 F (37.3 C), Min:98.5 F (36.9 C), Max:99.8 F (37.7 C)   Recent Labs Lab 08/20/15 1633 08/20/15 1851 08/21/15 0536 08/22/15 0248 08/22/15 1002 08/23/15 0548 08/24/15 0224 08/24/15 0615  WBC 6.2  --  5.7 12.2*  --  13.1* 13.2*  --   CREATININE 0.79  --  0.67 1.11 0.93 0.64 0.80 0.79  LATICACIDVEN  --  0.6  --   --   --   --   --   --     Estimated Creatinine Clearance: 86.1 mL/min (by C-G formula based on Cr of 0.79).    No Known Allergies  Antimicrobials this admission: 7/6 Levaquin >>  Dose adjustments this admission: n/a  Microbiology results: 7/6 MRSA: neg 7/6 BCx2: ngtd 7/7 tracheal aspirate:  Thank you for allowing pharmacy to be a part of this patient's care.  Fredrik RiggerMarkle, Kelsey Durflinger Sue 08/24/2015 11:20 AM

## 2015-08-25 LAB — URINALYSIS, ROUTINE W REFLEX MICROSCOPIC
BILIRUBIN URINE: NEGATIVE
Glucose, UA: NEGATIVE mg/dL
HGB URINE DIPSTICK: NEGATIVE
KETONES UR: NEGATIVE mg/dL
Leukocytes, UA: NEGATIVE
Nitrite: NEGATIVE
PH: 6 (ref 5.0–8.0)
Protein, ur: NEGATIVE mg/dL
SPECIFIC GRAVITY, URINE: 1.011 (ref 1.005–1.030)

## 2015-08-25 LAB — CBC WITH DIFFERENTIAL/PLATELET
BASOS ABS: 0 10*3/uL (ref 0.0–0.1)
BASOS PCT: 0 %
EOS ABS: 0.1 10*3/uL (ref 0.0–0.7)
Eosinophils Relative: 1 %
HEMATOCRIT: 48.6 % (ref 39.0–52.0)
HEMOGLOBIN: 15.4 g/dL (ref 13.0–17.0)
Lymphocytes Relative: 16 %
Lymphs Abs: 1.6 10*3/uL (ref 0.7–4.0)
MCH: 30.1 pg (ref 26.0–34.0)
MCHC: 31.7 g/dL (ref 30.0–36.0)
MCV: 94.9 fL (ref 78.0–100.0)
MONOS PCT: 9 %
Monocytes Absolute: 0.9 10*3/uL (ref 0.1–1.0)
NEUTROS ABS: 7.6 10*3/uL (ref 1.7–7.7)
NEUTROS PCT: 74 %
Platelets: 147 10*3/uL — ABNORMAL LOW (ref 150–400)
RBC: 5.12 MIL/uL (ref 4.22–5.81)
RDW: 14.5 % (ref 11.5–15.5)
WBC: 10.1 10*3/uL (ref 4.0–10.5)

## 2015-08-25 LAB — GLUCOSE, CAPILLARY
GLUCOSE-CAPILLARY: 102 mg/dL — AB (ref 65–99)
GLUCOSE-CAPILLARY: 139 mg/dL — AB (ref 65–99)
Glucose-Capillary: 93 mg/dL (ref 65–99)
Glucose-Capillary: 104 mg/dL — ABNORMAL HIGH (ref 65–99)
Glucose-Capillary: 116 mg/dL — ABNORMAL HIGH (ref 65–99)

## 2015-08-25 LAB — RENAL FUNCTION PANEL
ALBUMIN: 3.1 g/dL — AB (ref 3.5–5.0)
ANION GAP: 5 (ref 5–15)
BUN: 12 mg/dL (ref 6–20)
CALCIUM: 9 mg/dL (ref 8.9–10.3)
CO2: 31 mmol/L (ref 22–32)
Chloride: 108 mmol/L (ref 101–111)
Creatinine, Ser: 0.73 mg/dL (ref 0.61–1.24)
GFR calc Af Amer: >60 mL/min (ref >60)
GLUCOSE: 85 mg/dL (ref 65–99)
PHOSPHORUS: 2.8 mg/dL (ref 2.5–4.6)
POTASSIUM: 3.2 mmol/L — AB (ref 3.5–5.1)
SODIUM: 144 mmol/L (ref 135–145)

## 2015-08-25 LAB — LEGIONELLA PNEUMOPHILA SEROGP 1 UR AG: L. pneumophila Serogp 1 Ur Ag: NEGATIVE

## 2015-08-25 LAB — MAGNESIUM: MAGNESIUM: 2 mg/dL (ref 1.7–2.4)

## 2015-08-25 MED ORDER — INSULIN ASPART 100 UNIT/ML ~~LOC~~ SOLN: 0.0000 [IU] | Freq: Three times a day (TID) | SUBCUTANEOUS | Status: DC

## 2015-08-25 MED ORDER — BUDESONIDE 0.25 MG/2ML IN SUSP
0.2500 mg | Freq: Two times a day (BID) | RESPIRATORY_TRACT | Status: DC
  Administered 2015-08-25: 0.25 mg via RESPIRATORY_TRACT
  Filled 2015-08-25 (×2): qty 2

## 2015-08-25 MED ORDER — POTASSIUM CHLORIDE CRYS ER 20 MEQ PO TBCR
40.0000 meq | EXTENDED_RELEASE_TABLET | Freq: Once | ORAL | Status: AC
  Administered 2015-08-25: 40 meq via ORAL
  Filled 2015-08-25: qty 2

## 2015-08-25 MED ORDER — MIDAZOLAM HCL 2 MG/2ML IJ SOLN
0.5000 mg | Freq: Once | INTRAMUSCULAR | Status: AC
  Administered 2015-08-25: 0.5 mg via INTRAVENOUS
  Filled 2015-08-25: qty 2

## 2015-08-25 MED ORDER — IPRATROPIUM-ALBUTEROL 0.5-2.5 (3) MG/3ML IN SOLN
3.0000 mL | RESPIRATORY_TRACT | Status: DC | PRN
  Administered 2015-08-25: 3 mL via RESPIRATORY_TRACT
  Filled 2015-08-25: qty 6

## 2015-08-25 MED ORDER — FENOFIBRATE 160 MG PO TABS
160.0000 mg | ORAL_TABLET | Freq: Every day | ORAL | Status: DC
  Administered 2015-08-25 – 2015-08-29 (×5): 160 mg via ORAL
  Filled 2015-08-25 (×5): qty 1

## 2015-08-25 MED ORDER — CETYLPYRIDINIUM CHLORIDE 0.05 % MT LIQD: 7.0000 mL | Freq: Two times a day (BID) | OROMUCOSAL | Status: DC

## 2015-08-25 MED ORDER — HYDRALAZINE HCL 10 MG PO TABS
10.0000 mg | ORAL_TABLET | Freq: Three times a day (TID) | ORAL | Status: DC
Start: 2015-08-25 — End: 2015-08-29
  Administered 2015-08-25 – 2015-08-29 (×11): 10 mg via ORAL
  Filled 2015-08-25 (×12): qty 1

## 2015-08-25 MED ORDER — POTASSIUM CHLORIDE 10 MEQ/100ML IV SOLN
10.0000 meq | INTRAVENOUS | Status: AC
  Administered 2015-08-25 (×4): 10 meq via INTRAVENOUS
  Filled 2015-08-25 (×4): qty 100

## 2015-08-25 NOTE — Progress Notes (Signed)
[facility] FOR AM LAB REPLACEMENT ONLY  [facility] based on the criteria listed below:   1. Is GFR >/= 40 ml/min? Yes.    Patient's GFR today is >60 2. Is urine output >/= 0.5 ml/kg/hr for the last 6 hours? Yes.   Patient's UOP is 0.9 ml/kg/hr 3. Is BUN < 60 mg/dL? Yes.    Patient's BUN today is 12 4. Abnormal electrolyte(s): K 3.2 5. Ordered repletion with: per protocol 6. If a panic level lab has been reported, has the CCM MD in charge been notified? No..   Physician:    Markus DaftWHELAN, Claudina Oliphant A 08/25/2015 6:42 AM

## 2015-08-25 NOTE — Progress Notes (Signed)
eLink Physician-Brief Progress Note Patient Name: Sean ConchJames Arellano DOB: October 24, 1929 MRN: [id number]   Date of Service  08/25/2015  HPI/Events of Note  Agitation - looks calm now on video assessment. Already on Haldol PRN.   eICU Interventions  Will order: 1. Bilateral soft wrist and Posey Belt restraints. 2. Versed 0.5 mg IV X 1.     Intervention Category Minor Interventions: Agitation / anxiety - evaluation and management  [name] 08/25/2015, 1:06 AM

## 2015-08-25 NOTE — Progress Notes (Addendum)
[facility] TEAM 1 - Stepdown/ICU TEAM  Sean Arellano  WUJ:[id number] DOB: 05-Dec-1929 DOA: 08/20/2015 PCP: No primary care provider on file.    Brief Narrative:  82 year old male with hx COPD and recent admission to [facility] for hypercarbic respiratory failure who presented to [facility] ED 7/5 with altered mental status for 12 hours prior to arrival. Per son this is a very similar presentation to when he was admitted to [facility]. Venous blood gas in the emergency department noted pH 7.29 and CO2 95. He was placed on BiPAP, however, blood gas only worsened. CXR with no evidence of acute process. Due to lack of improvement on BiPAP, PCCM admitted.  Significant Events: 7/5 - Admitted to ICU on BiPAP 7/6 - Intubated for respiratory insufficiency 7/8 - Extubated   Subjective: The patient is awake and pleasant but quite confused.  He denies any complaints but his review of systems is not felt to be accurate presently due to his confusion.  Assessment & Plan:  Acute on Chronic Hypercarbic and Hypoxic Respiratory Failure - Secondary to COPD Exacerbation - Ongoing Tobacco Use Weaning FiO2 for Sat 88-94% to minimize VQ mismatching - continue medical therapy to include steroids and nebulizers   Possible Acute Bronchitis vs CAP Levaquin   Elevated Troponin I demand ischemia w/ hypoxia - peaked at 0.06  Bradycardia  Appears to have resolved - follow on telemetry  HTN Poorly controlled likely related to agitation - adjust medical therapy and follow  Hyperlipidemia Resume home [facility]  Hypokalemia Replace and follow - magnesium is normal  Hypophosphatemia Resolved   Polycythemia due to chronic hypoxia  Thrombocytopenia Stable  Hyperglycemia secondary to steroids - A1c 5.6  Acute Encephalopathy multifactorial from hypercarbia, hypoxia, & toxic - ammonia elevated earlier in stay - recheck in AM   DVT prophylaxis: lovenox  Code Status: FULL CODE Family Communication: no family present at  time of exam  Disposition Plan: awaiting tele bed - PT/OT   Consultants:  PCCM  Antimicrobials:  Levaquin 7/6 >  Objective: Blood pressure 172/81, pulse 53, temperature 98.5 F (36.9 C), temperature source Oral, resp. rate 37, height 6' (1.829 m), weight 108.8 kg (239 lb 13.8 oz), SpO2 98 %.  Intake/Output Summary (Last 24 hours) at 08/25/15 0957 Last data filed at 08/25/15 0900  Gross per 24 hour  Intake   1730 ml  Output   2000 ml  Net   -270 ml   Filed Weights   08/23/15 0328 08/24/15 0423 08/25/15 0500  Weight: 106.2 kg (234 lb 2.1 oz) 105 kg (231 lb 7.7 oz) 108.8 kg (239 lb 13.8 oz)    Examination: General: No acute respiratory distress evident  Lungs: Expiratory wheezes throughout with fair air movement without focal crackles  Cardiovascular: Regular rate and rhythm without murmur gallop or rub normal S1 and S2 Abdomen: Nontender, overweight, soft, bowel sounds positive, no rebound, no ascites, no appreciable mass Extremities: No significant cyanosis, or clubbing - trace edema bilateral lower extremities  CBC:  Recent Labs Lab 08/21/15 0536 08/22/15 0248 08/23/15 0548 08/24/15 0224 08/25/15 0306  WBC 5.7 12.2* 13.1* 13.2* 10.1  NEUTROABS  --   --  10.1* 10.0* 7.6  HGB 16.8 17.1* 17.1* 16.1 15.4  HCT 56.3* 54.0* 52.9* 50.9 48.6  MCV 102.0* 95.1 93.6 95.7 94.9  PLT 144* 168 148* 143* 956*   Basic Metabolic Panel:  Recent Labs Lab 08/22/15 0248 08/22/15 1002 08/23/15 0548 08/24/15 0224 08/24/15 0615 08/25/15 0306  NA 141 143 143  142 144 144  K 3.2* 3.2* 3.5 3.3* 3.4* 3.2*  CL 102 104 110 107 107 108  CO2 30 31 26 29 30 31   GLUCOSE 175* 172* 120* 89 82 85  BUN 25* 27* 24* 21* 20 12  CREATININE 1.11 0.93 0.64 0.80 0.79 0.73  CALCIUM 9.5 9.2 9.3 9.1 9.2 9.0  MG 1.9  --  1.8 1.9  --  2.0  PHOS <1.0* 2.9 3.4 3.2  --  2.8   GFR: Estimated Creatinine Clearance: 87.6 mL/min (by C-G formula based on Cr of 0.73).  Liver Function Tests:  Recent  Labs Lab 08/20/15 1851 08/23/15 0548 08/24/15 0224 08/25/15 0306  AST 24  --   --   --   ALT 20  --   --   --   ALKPHOS 44  --   --   --   BILITOT 0.5  --   --   --   PROT 7.0  --   --   --   ALBUMIN 3.8 3.0* 3.1* 3.1*    Recent Labs Lab 08/20/15 1851  AMMONIA 62*    Cardiac Enzymes:  Recent Labs Lab 08/20/15 1851 08/20/15 2059 08/21/15 0240 08/21/15 0730 08/21/15 1252  TROPONINI 0.06* 0.06* 0.06* 0.06* 0.03*    HbA1C: HGB A1C MFR BLD  Date/Time Value Ref Range Status  08/22/2015 06:55 AM 5.6 4.8 - 5.6 % Final    Comment:    (NOTE)         Pre-diabetes: 5.7 - 6.4         Diabetes: >6.4         Glycemic control for adults with diabetes: <7.0     CBG:  Recent Labs Lab 08/24/15 1623 08/24/15 1943 08/25/15 08/25/15 0412 08/25/15 0835  GLUCAP 94 125* 139* 93 104*    Recent Results (from the past 240 hour(s))  MRSA PCR Screening     Status: None   Collection Time: 08/21/15  2:07 AM  Result Value Ref Range Status   MRSA by PCR NEGATIVE NEGATIVE Final    Comment:        The GeneXpert MRSA Assay (FDA approved for NASAL specimens only), is one component of a comprehensive MRSA colonization surveillance program. It is not intended to diagnose MRSA infection nor to guide or monitor treatment for MRSA infections.   Culture, blood (routine x 2)     Status: None (Preliminary result)   Collection Time: 08/21/15  2:24 AM  Result Value Ref Range Status   Specimen Description BLOOD LEFT ANTECUBITAL  Final   Special Requests IN PEDIATRIC BOTTLE 1CC  Final   Culture NO GROWTH 3 DAYS  Final   Report Status PENDING  Incomplete  Culture, blood (routine x 2)     Status: None (Preliminary result)   Collection Time: 08/21/15  2:36 AM  Result Value Ref Range Status   Specimen Description BLOOD LEFT HAND  Final   Special Requests IN PEDIATRIC BOTTLE 1CC  Final   Culture NO GROWTH 3 DAYS  Final   Report Status PENDING  Incomplete  Culture, respiratory  (NON-Expectorated)     Status: None   Collection Time: 08/22/15 10:07 AM  Result Value Ref Range Status   Specimen Description TRACHEAL ASPIRATE  Final   Special Requests Normal  Final   Gram Stain   Final    MODERATE WBC PRESENT,BOTH PMN AND MONONUCLEAR FEW SQUAMOUS EPITHELIAL CELLS PRESENT MODERATE GRAM POSITIVE COCCI IN PAIRS AND CHAINS RARE BUDDING YEAST SEEN  RARE GRAM NEGATIVE COCCI IN PAIRS    Culture Consistent with normal respiratory flora.  Final   Report Status 08/24/2015 FINAL  Final  Respiratory Panel by PCR     Status: None   Collection Time: 08/22/15 10:44 AM  Result Value Ref Range Status   Adenovirus NOT DETECTED NOT DETECTED Final   Coronavirus 229E NOT DETECTED NOT DETECTED Final   Coronavirus HKU1 NOT DETECTED NOT DETECTED Final   Coronavirus NL63 NOT DETECTED NOT DETECTED Final   Coronavirus OC43 NOT DETECTED NOT DETECTED Final   Metapneumovirus NOT DETECTED NOT DETECTED Final   Rhinovirus / Enterovirus NOT DETECTED NOT DETECTED Final   Influenza A NOT DETECTED NOT DETECTED Final   Influenza A H1 NOT DETECTED NOT DETECTED Final   Influenza A H1 2009 NOT DETECTED NOT DETECTED Final   Influenza A H3 NOT DETECTED NOT DETECTED Final   Influenza B NOT DETECTED NOT DETECTED Final   Parainfluenza Virus 1 NOT DETECTED NOT DETECTED Final   Parainfluenza Virus 2 NOT DETECTED NOT DETECTED Final   Parainfluenza Virus 3 NOT DETECTED NOT DETECTED Final   Parainfluenza Virus 4 NOT DETECTED NOT DETECTED Final   Respiratory Syncytial Virus NOT DETECTED NOT DETECTED Final   Bordetella pertussis NOT DETECTED NOT DETECTED Final   Chlamydophila pneumoniae NOT DETECTED NOT DETECTED Final   Mycoplasma pneumoniae NOT DETECTED NOT DETECTED Final     Scheduled Meds: . amLODipine  10 mg Oral Daily  . antiseptic oral rinse  7 mL Mouth Rinse QID  . aspirin EC  81 mg Oral Daily  . chlorhexidine gluconate (SAGE KIT)  15 mL Mouth Rinse BID  . cloNIDine  0.1 mg Oral Q6H  . enoxaparin  (LOVENOX) injection  40 mg Subcutaneous Q24H  . insulin aspart  0-9 Units Subcutaneous TID WC  . levofloxacin (LEVAQUIN) IV  500 mg Intravenous Q24H  . nicotine  7 mg Transdermal Q24H  . potassium chloride  10 mEq Intravenous Q1 Hr x 4  . predniSONE  60 mg Oral Q breakfast   Continuous Infusions: . sodium chloride 50 mL/hr at 08/24/15 1512     LOS: 4 days   Time spent: 35 minutes   Cherene Altes, MD [facility] Office  [phone] Pager - Text Page per Shea Evans as per below:  On-Call/Text Page:      [email or web address]      password [email or web address]  If 7PM-7AM, please contact night-coverage [email or web address] Password [email or web address] 08/25/2015, 9:57 AM

## 2015-08-25 NOTE — Evaluation (Signed)
Physical Therapy Evaluation Patient Details Name: Sean Arellano MRN: [id number] DOB: 1929/08/01 Today's Date: 08/25/2015   History of Present Illness  Pt adm with hypercarbic respiratory failure and intubated 7/6 and extubated 7/8. Pt also with acute encephalopathy. Pt with very recent admit for same thing at [facility]. PMH - COPD, HTN  Clinical Impression  Pt admitted with above diagnosis and presents to PT with functional limitations due to deficits listed below (See PT problem list). Pt needs skilled PT to maximize independence and safety to allow discharge to ST-SNF.      Follow Up Recommendations SNF (unless pt has 24 hour assist at home)    Equipment Recommendations  Other (comment) (to be determined)    Recommendations for Other Services       Precautions / Restrictions Precautions Precautions: Fall Restrictions Weight Bearing Restrictions: No      Mobility  Bed Mobility Overal bed mobility: Needs Assistance Bed Mobility: Supine to Sit     Supine to sit: +2 for physical assistance;Min assist     General bed mobility comments: Assist to elevate trunk into sitting and bring hips to EOB  Transfers Overall transfer level: Needs assistance Equipment used: 2 person hand held assist Transfers: Sit to/from Stand;Stand Pivot Transfers Sit to Stand: +2 physical assistance;Min assist Stand pivot transfers: +2 physical assistance;Min assist       General transfer comment: Assist to bring hips up and for balance  Ambulation/Gait Ambulation/Gait assistance: +2 physical assistance;Min assist Ambulation Distance (Feet): 6 Feet (forward/backwards) Assistive device: 2 person hand held assist Gait Pattern/deviations: Step-through pattern;Decreased step length - right;Decreased step length - left;Trunk flexed Gait velocity: decr Gait velocity interpretation: <1.8 ft/sec, indicative of risk for recurrent falls General Gait Details: Assist for balance and support. Pt on  O2. Dyspnea 3/4 with respiratory rate of 38.  Stairs            Wheelchair Mobility    Modified Rankin (Stroke Patients Only)       Balance Overall balance assessment: Needs assistance Sitting-balance support: Bilateral upper extremity supported;Feet supported Sitting balance-Leahy Scale: Poor Sitting balance - Comments: support of UE's and close supervision   Standing balance support: Bilateral upper extremity supported Standing balance-Leahy Scale: Poor                               Pertinent Vitals/Pain Pain Assessment: No/denies pain    Home Living Family/patient expects to be discharged to:: Private residence Living Arrangements: Other relatives (grandson) Available Help at Discharge:  Posey Rea)             Additional Comments: Unsure of accuracy of patient    Prior Function Level of Independence: Independent         Comments: Unsure of accuracy of pt     Hand Dominance        Extremity/Trunk Assessment   Upper Extremity Assessment: Generalized weakness           Lower Extremity Assessment: Generalized weakness         Communication      Cognition Arousal/Alertness: Awake/alert Behavior During Therapy: WFL for tasks assessed/performed Overall Cognitive Status: No family/caregiver present to determine baseline cognitive functioning Area of Impairment: Attention;Following commands;Safety/judgement;Problem solving;Memory   Current Attention Level: Sustained Memory: Decreased short-term memory Following Commands: Follows one step commands consistently;Follows one step commands with increased time Safety/Judgement: Decreased awareness of safety;Decreased awareness of deficits   Problem Solving: Slow processing;Requires verbal  cues;Requires tactile cues;Decreased initiation      General Comments      Exercises        Assessment/Plan    PT Assessment Patient needs continued PT services  PT Diagnosis Difficulty  walking;Generalized weakness   PT Problem List Decreased strength;Decreased activity tolerance;Decreased balance;Decreased mobility;Decreased cognition  PT Treatment Interventions DME instruction;Gait training;Functional mobility training;Therapeutic activities;Therapeutic exercise;Balance training;Patient/family education   PT Goals (Current goals can be found in the Care Plan section) Acute Rehab PT Goals Patient Stated Goal: Pt didn't state PT Goal Formulation: With patient Time For Goal Achievement: 09/08/15 Potential to Achieve Goals: Good    Frequency Min 3X/week   Barriers to discharge Other (comment) (Unsure of caregiver support)      Co-evaluation               End of Session Equipment Utilized During Treatment: Gait belt;Oxygen Activity Tolerance: Patient limited by fatigue Patient left: in chair;with call bell/phone within reach;with nursing/sitter in room;with restraints reapplied Nurse Communication: Mobility status         Time: 1043-1110 PT Time Calculation (min) (ACUTE ONLY): 27 min   Charges:   PT Evaluation $PT Eval Moderate Complexity: 1 Procedure PT Treatments $Gait Training: 8-22 mins   PT G Codes:        Atzel Mccambridge 08/25/2015, 2:48 PM [facility] G[facility] PT [phone]

## 2015-08-25 NOTE — Care Management Important Message (Signed)
Important Message  Patient Details  Name: Sean Arellano MRN: [id number] Date of Birth: [birth year]   Medicare Important Message Given:  Yes    Bernadette HoitShoffner, [name] 08/25/2015, 8:13 AM

## 2015-08-26 ENCOUNTER — Inpatient Hospital Stay (HOSPITAL_COMMUNITY): Payer: Medicare Other

## 2015-08-26 DIAGNOSIS — J441 Chronic obstructive pulmonary disease with (acute) exacerbation: Secondary | ICD-10-CM

## 2015-08-26 DIAGNOSIS — G9340 Encephalopathy, unspecified: Secondary | ICD-10-CM

## 2015-08-26 DIAGNOSIS — R404 Transient alteration of awareness: Secondary | ICD-10-CM

## 2015-08-26 DIAGNOSIS — J9602 Acute respiratory failure with hypercapnia: Secondary | ICD-10-CM

## 2015-08-26 LAB — COMPREHENSIVE METABOLIC PANEL
ALT: 63 U/L (ref 17–63)
ANION GAP: 9 (ref 5–15)
AST: 31 U/L (ref 15–41)
Albumin: 3.3 g/dL — ABNORMAL LOW (ref 3.5–5.0)
Alkaline Phosphatase: 40 U/L (ref 38–126)
BUN: 17 mg/dL (ref 6–20)
CHLORIDE: 104 mmol/L (ref 101–111)
CO2: 31 mmol/L (ref 22–32)
Calcium: 9.1 mg/dL (ref 8.9–10.3)
Creatinine, Ser: 0.96 mg/dL (ref 0.61–1.24)
GFR calc non Af Amer: >60 mL/min (ref >60)
Glucose, Bld: 101 mg/dL — ABNORMAL HIGH (ref 65–99)
POTASSIUM: 4.4 mmol/L (ref 3.5–5.1)
SODIUM: 144 mmol/L (ref 135–145)
Total Bilirubin: 1.1 mg/dL (ref 0.3–1.2)
Total Protein: 6.4 g/dL — ABNORMAL LOW (ref 6.5–8.1)

## 2015-08-26 LAB — CBC
HCT: 52.9 % — ABNORMAL HIGH (ref 39.0–52.0)
HCT: 53.1 % — ABNORMAL HIGH (ref 39.0–52.0)
HEMOGLOBIN: 16.2 g/dL (ref 13.0–17.0)
Hemoglobin: 16.2 g/dL (ref 13.0–17.0)
MCH: 30.1 pg (ref 26.0–34.0)
MCH: 29.9 pg (ref 26.0–34.0)
MCHC: 30.5 g/dL (ref 30.0–36.0)
MCHC: 30.6 g/dL (ref 30.0–36.0)
MCV: 98.1 fL (ref 78.0–100.0)
MCV: 98 fL (ref 78.0–100.0)
Platelets: 148 10*3/uL — ABNORMAL LOW (ref 150–400)
Platelets: 160 10*3/uL (ref 150–400)
RBC: 5.39 MIL/uL (ref 4.22–5.81)
RBC: 5.42 MIL/uL (ref 4.22–5.81)
RDW: 14.5 % (ref 11.5–15.5)
RDW: 14.6 % (ref 11.5–15.5)
WBC: 9 10*3/uL (ref 4.0–10.5)
WBC: 9.1 10*3/uL (ref 4.0–10.5)

## 2015-08-26 LAB — POCT I-STAT 3, ART BLOOD GAS (G3+)
ACID-BASE EXCESS: 4 mmol/L — AB (ref 0.0–2.0)
Bicarbonate: 32.6 mEq/L — ABNORMAL HIGH (ref 20.0–24.0)
O2 SAT: 91 %
PCO2 ART: 64.5 mmHg — AB (ref 35.0–45.0)
PH ART: 7.309 — AB (ref 7.350–7.450)
PO2 ART: 69 mmHg — AB (ref 80.0–100.0)
Patient temperature: 98
TCO2: 35 mmol/L (ref 0–100)

## 2015-08-26 LAB — BLOOD GAS, ARTERIAL
ACID-BASE EXCESS: 5.1 mmol/L — AB (ref 0.0–2.0)
BICARBONATE: 32.7 meq/L — AB (ref 20.0–24.0)
DRAWN BY: 290171
FIO2: 0.35
O2 Saturation: 97 %
PATIENT TEMPERATURE: 98.6
PCO2 ART: 87 mmHg — AB (ref 35.0–45.0)
PH ART: 7.2 — AB (ref 7.350–7.450)
TCO2: 35.4 mmol/L (ref 0–100)
pO2, Arterial: 107 mmHg — ABNORMAL HIGH (ref 80.0–100.0)

## 2015-08-26 LAB — BASIC METABOLIC PANEL
Anion gap: 6 (ref 5–15)
BUN: 16 mg/dL (ref 6–20)
CO2: 32 mmol/L (ref 22–32)
Calcium: 8.9 mg/dL (ref 8.9–10.3)
Chloride: 105 mmol/L (ref 101–111)
Creatinine, Ser: 0.92 mg/dL (ref 0.61–1.24)
GFR calc Af Amer: >60 mL/min (ref >60)
GFR calc non Af Amer: >60 mL/min (ref >60)
Glucose, Bld: 97 mg/dL (ref 65–99)
Potassium: 4.5 mmol/L (ref 3.5–5.1)
Sodium: 143 mmol/L (ref 135–145)

## 2015-08-26 LAB — GLUCOSE, CAPILLARY
GLUCOSE-CAPILLARY: 103 mg/dL — AB (ref 65–99)
GLUCOSE-CAPILLARY: 119 mg/dL — AB (ref 65–99)
GLUCOSE-CAPILLARY: 204 mg/dL — AB (ref 65–99)
Glucose-Capillary: 101 mg/dL — ABNORMAL HIGH (ref 65–99)
Glucose-Capillary: 66 mg/dL (ref 65–99)
Glucose-Capillary: 97 mg/dL (ref 65–99)

## 2015-08-26 LAB — CULTURE, BLOOD (ROUTINE X 2)
CULTURE: NO GROWTH
CULTURE: NO GROWTH

## 2015-08-26 LAB — TROPONIN I
TROPONIN I: 0.06 ng/mL — AB (ref <0.03)
TROPONIN I: 0.05 ng/mL — AB (ref <0.03)
Troponin I: 0.06 ng/mL (ref <0.03)

## 2015-08-26 LAB — PROCALCITONIN: Procalcitonin: <0.1 ng/mL

## 2015-08-26 LAB — LACTIC ACID, PLASMA: LACTIC ACID, VENOUS: 1.1 mmol/L (ref 0.5–1.9)

## 2015-08-26 LAB — AMMONIA: Ammonia: 34 umol/L (ref 9–35)

## 2015-08-26 MED ORDER — CETYLPYRIDINIUM CHLORIDE 0.05 % MT LIQD: 7.0000 mL | Freq: Two times a day (BID) | OROMUCOSAL | Status: DC

## 2015-08-26 MED ORDER — CHLORHEXIDINE GLUCONATE 0.12 % MT SOLN
15.0000 mL | Freq: Two times a day (BID) | OROMUCOSAL | Status: DC
  Administered 2015-08-26: 15 mL via OROMUCOSAL

## 2015-08-26 MED ORDER — IPRATROPIUM-ALBUTEROL 0.5-2.5 (3) MG/3ML IN SOLN
3.0000 mL | RESPIRATORY_TRACT | Status: DC
  Administered 2015-08-26 – 2015-08-28 (×14): 3 mL via RESPIRATORY_TRACT
  Filled 2015-08-26 (×14): qty 3

## 2015-08-26 MED ORDER — CETYLPYRIDINIUM CHLORIDE 0.05 % MT LIQD
7.0000 mL | Freq: Two times a day (BID) | OROMUCOSAL | Status: DC
  Administered 2015-08-26 – 2015-08-28 (×6): 7 mL via OROMUCOSAL

## 2015-08-26 MED ORDER — INSULIN ASPART 100 UNIT/ML ~~LOC~~ SOLN: 2.0000 [IU] | SUBCUTANEOUS | Status: DC

## 2015-08-26 MED ORDER — SODIUM CHLORIDE 0.9 % IV SOLN
INTRAVENOUS | Status: DC
  Administered 2015-08-26: 250 mL via INTRAVENOUS

## 2015-08-26 MED ORDER — METHYLPREDNISOLONE SODIUM SUCC 40 MG IJ SOLR
40.0000 mg | Freq: Two times a day (BID) | INTRAMUSCULAR | Status: DC
  Administered 2015-08-26: 40 mg via INTRAVENOUS
  Filled 2015-08-26: qty 1

## 2015-08-26 MED ORDER — BUDESONIDE 0.5 MG/2ML IN SUSP
0.5000 mg | Freq: Two times a day (BID) | RESPIRATORY_TRACT | Status: DC
  Administered 2015-08-26 – 2015-08-28 (×5): 0.5 mg via RESPIRATORY_TRACT
  Filled 2015-08-26 (×5): qty 2

## 2015-08-26 MED ORDER — POTASSIUM CHLORIDE 10 MEQ/100ML IV SOLN
10.0000 meq | INTRAVENOUS | Status: DC
  Administered 2015-08-26 (×2): 10 meq via INTRAVENOUS
  Filled 2015-08-26 (×4): qty 100

## 2015-08-26 MED ORDER — CHLORHEXIDINE GLUCONATE 0.12 % MT SOLN
15.0000 mL | Freq: Two times a day (BID) | OROMUCOSAL | Status: DC
  Administered 2015-08-26 – 2015-08-29 (×6): 15 mL via OROMUCOSAL
  Filled 2015-08-26 (×5): qty 15

## 2015-08-26 MED ORDER — METHYLPREDNISOLONE SODIUM SUCC 40 MG IJ SOLR
40.0000 mg | Freq: Every day | INTRAMUSCULAR | Status: DC
  Administered 2015-08-27: 40 mg via INTRAVENOUS
  Filled 2015-08-26: qty 1

## 2015-08-26 MED ORDER — FUROSEMIDE 10 MG/ML IJ SOLN
40.0000 mg | Freq: Once | INTRAMUSCULAR | Status: AC
  Administered 2015-08-26: 40 mg via INTRAVENOUS
  Filled 2015-08-26: qty 4

## 2015-08-26 MED ORDER — INSULIN ASPART 100 UNIT/ML ~~LOC~~ SOLN
2.0000 [IU] | Freq: Three times a day (TID) | SUBCUTANEOUS | Status: DC
  Administered 2015-08-26: 6 [IU] via SUBCUTANEOUS
  Administered 2015-08-27: 4 [IU] via SUBCUTANEOUS

## 2015-08-26 MED ORDER — DOXYCYCLINE HYCLATE 100 MG IV SOLR
100.0000 mg | Freq: Two times a day (BID) | INTRAVENOUS | Status: AC
  Administered 2015-08-26 (×2): 100 mg via INTRAVENOUS
  Filled 2015-08-26 (×2): qty 100

## 2015-08-26 MED ORDER — ARFORMOTEROL TARTRATE 15 MCG/2ML IN NEBU
15.0000 ug | INHALATION_SOLUTION | Freq: Two times a day (BID) | RESPIRATORY_TRACT | Status: DC
  Administered 2015-08-26 – 2015-08-28 (×5): 15 ug via RESPIRATORY_TRACT
  Filled 2015-08-26 (×5): qty 2

## 2015-08-26 NOTE — Progress Notes (Signed)
eLink Physician-Brief Progress Note Patient Name: Sean Arellano DOB: [birth year] MRN: [id number]   Date of Service  08/26/2015  HPI/Events of Note  Contacted by hospitalist regarding patient with worsening hypercarbia and altered mentation. Known to me from this hospitalization with intubation for hypercarbia & failed BiPAP. Reviewing patient's  MAR  Shows scheduled nebs stopped.  eICU Interventions  1. Transferring to SDU 2. TRH to remain primary 3. Trial of BiPAP 4. Restarting Duoneb q4hr 5. Camera check on arrival w/ repeat abg & close monitoring     Intervention Category Major Interventions: Respiratory failure - evaluation and management  Lawanda CousinsJennings Mazzy Santarelli 08/26/2015, 12:26 AM

## 2015-08-26 NOTE — Progress Notes (Signed)
eLink Physician-Brief Progress Note Patient Name: Sean ConchJames Arellano DOB: 1930/01/17 MRN: [id number]   Date of Service  08/26/2015  HPI/Events of Note  ABG reviewed. Patient still with minimal alert. Hypercarbia improving on BiPAP.   eICU Interventions  Instructed nurse to call code stroke      Intervention Category Major Interventions: Change in mental status - evaluation and management  Sean Arellano 08/26/2015, 4:05 AM

## 2015-08-26 NOTE — Progress Notes (Addendum)
Upon entering room, RN had difficulty arousing the patient even with deep sternal rub. Patient was using accessory muscles to breathe, had a 7-beat run of SVT, and would not open eyes to stimulation. Schorr, NP paged and Rapid Response was paged and respiratory therapy was called. EKG, ABG, portable chest xray was ordered and completed at bedside under rapid response direction. Schorr, NP came to see patient, orders for foley catheter and bipap were placed and initiated. Schorr,NP placed order for transfer. Patient transferred to 2H06 with rapid response nurses. Report given to [facility] nurse. Patient informed RN that he would like us to call his son, Chanetta MarshallJimmy. His son, Chanetta MarshallJimmy, was called by the RN and a message was left on the voicemail that the patient had been transferred to unit 2H.

## 2015-08-26 NOTE — Progress Notes (Signed)
Hypoglycemic Event  CBG: 66  Treatment: 15 GM carbohydrate snack  Symptoms: None  Follow-up CBG: Time: 2214 CBG Result: 97  Possible Reasons for Event: Inadequate meal intake  Comments/MD notified: Patient given orange juice and graham crackers. MD not notified.    Sean Arellano

## 2015-08-26 NOTE — Progress Notes (Signed)
Shift event note:  Notified by RN that pt has become minimally responsive w/ increased WOB and use of accessory muscles. RR RN and RRT were paged and responded to bedside. An ABG was obtained and stat PCXR. At bedside pt noted to be minimally responsive to noxious stimuli. 02 sats remain WNL's despite increased WOB. BBS w/ inspiratory/expiratory wheezes and crackles at bil bases. Pt placed on BiPAP and prepared for transfer. ABG reveals 7.2/87/107/32.7. CXR reveals mild cardiac enlargement w/ mild vascular congestion w/ developing infiltrate (or atelectasis) to LLL, increased since previous CXR. Pt given Lasix 40 mg IV and a foley was placed. After approx 20 min on BiPAP pt became more awake, eyes open and is able to state son's name and grandson's name and to request one or the other are called. RN to place call to family to inform them of likely transfer. Assessment/Plan: 1.Acute hypercarbic respiratory failure: In setting of acute COPD exacerbation. Discussed pt w/ Dr Celene SkeenNester w/ [facility]. Will transfer to SDU for close monitoring and continue BiPAP. Appreciate CCM input and management.   Sean ChangKatherine P. Marchetta Navratil, NP-C [facility] Pager [phone]

## 2015-08-26 NOTE — Significant Event (Deleted)
Rapid Response Event Note  Overview: Time Called: 2350 Arrival Time: 2350 Event Type: Neurologic, Respiratory  Initial Focused Assessment:   Interventions:  Plan of Care (if not transferred):  Event Summary: Name of Physician Notified: Vernona RiegerKathryn Schorr NP  at 2353  Name of Consulting Physician Notified: Dr. Celene SkeenNester PCCM consulted per NP   at    Outcome: Transferred (Comment)  Event End Time: 0105  Gerarda FractionSHULAR, Draden Cottingham Paige

## 2015-08-26 NOTE — Significant Event (Signed)
Rapid Response Event Note RN asked to see pt while rounding on floor, per floor RN pt unresponsive to sternal rub, wet breath sounds bilateral.  Overview: Time Called: 2350 Arrival Time: 2350 Event Type: Neurologic, Respiratory  Initial Focused Assessment: Pt responsive to deep sternal rub, bilateral lungs wet, using accessory muscle. BP 152/77, HR 106, RR 32, 96 % on 3L [place].   Interventions:  RT at bedside. Neb treatment done, ABG, EKG and CXR completed STAT. [facility] NP to bedside, PCCM consulted and provided with ABG results; Ph 7.2, Co2 87, Hco3 32, Po2 107. Pt placed on Bipap, improved responsiveness rapidly. Report provided per floor rn, Pt transferred 2H02 on BiPap at 0105.     Event Summary: Name of Physician Notified: Vernona RiegerKathryn Schorr NP  at 2353  Name of Consulting Physician Notified: Dr. Celene SkeenNester PCCM consulted per NP   at    Outcome: Transferred (Comment) Pt transferred to 2H02 at 0105     [facility], Sandi CarneLESLIE Paige

## 2015-08-26 NOTE — Progress Notes (Signed)
RT informed by RN to not place patient on BIPAP for the night due to oxygen toxicity the previous night of wearing BIPAP. BIPAP is in room and on standby if needed throughout the night. Patient currently on 1LNC with CO2 being monitored as well. Will continue to monitor.

## 2015-08-26 NOTE — Consult Note (Signed)
Admission H&P    Chief Complaint: Transient unresponsiveness.  HPI: Sean Arellano is an 82 y.o. male with a history of COPD, hypertension, high cholesterol and tobacco abuse, admitted on 08/20/2015 for acute on chronic COPD exacerbation and respiratory failure requiring intubation. He was subsequently extubated on 08/23/2015. Patient has been on BiPAP and has experienced episodes of confusion and agitation. He was evaluated shortly before midnight by rapid response nurse for reduced responsiveness. He has chronic hypercarbia and CO2 at that time was 85 with pH of 7.2. He was moved from telemetry to the stepdown unit. He was last seen able unresponsive at 2 AM. He was noted at 4 AM to be unresponsive, including sternal rub. Code stroke was activated. Repeat blood gas showed PCO2 of 65 and pH of 7.309. Reintubation was considered. Patient showed spontaneous signs of improving mental status and respirations. Intubation was deferred. He's remains on BiPAP with somewhat labored breathing. He was responsive verbally and was slightly confused. Neurologic exam was unremarkable except for equivocal facial numbness. Stat CT was deferred because of his respiratory status and lack of acute focal abnormalities. Code stroke was subsequently canceled.  Past Medical History  Diagnosis Date  . COPD (chronic obstructive pulmonary disease) ([place])   . Hypertension   . High cholesterol   . Smoker     unsure of ppd     History reviewed. No pertinent past surgical history.  History reviewed. No pertinent family history. Social History:  reports that he has been smoking Cigarettes.  He does not have any smokeless tobacco history on file. His alcohol and drug histories are not on file.  Allergies: No Known Allergies  Medications Prior to Admission  Medication Sig Dispense Refill  . amLODipine (NORVASC) 10 MG tablet Take 1 tablet by mouth daily.    Marland Kitchen aspirin 81 MG tablet Take 81 mg by mouth daily.    . fenofibrate  (TRICOR) 145 MG tablet Take 1 tablet by mouth daily.    . metoprolol tartrate (LOPRESSOR) 25 MG tablet Take 1 tablet by mouth daily.    . SYMBICORT 160-4.5 MCG/ACT inhaler Inhale 2 puffs into the lungs daily.      ROS: Source: Chart review Constitutional: weight loss, gain, night sweats, Fevers, chills, fatigue .  HEENT: headaches, Sore throat, sneezing, nasal congestion, post nasal drip, Difficulty swallowing, Tooth/dental problems, visual complaints visual changes, ear ache CV: chest pain, radiates: ,Orthopnea, PND, swelling in lower extremities, dizziness, palpitations, syncope.  GI heartburn, indigestion, abdominal pain, nausea, vomiting, diarrhea, change in bowel habits, loss of appetite, bloody stools.  Resp: cough, productive: , hemoptysis, dyspnea, chest pain, pleuritic.  Skin: rash or itching or icterus GU: dysuria, change in color of urine, urgency or frequency. flank pain, hematuria  MS: joint pain or swelling. decreased range of motion  Psych: change in mood or affect. depression or anxiety.  Neuro: difficulty with speech, weakness, numbness, ataxia   Physical Examination: Blood pressure 117/69, pulse 72, temperature 97.9 F (36.6 C), temperature source Axillary, resp. rate 18, height 6' (1.829 m), weight 102.649 kg (226 lb 4.8 oz), SpO2 98 %.  HEENT-  Normocephalic, no lesions, without obvious abnormality.  Normal external eye and conjunctiva.  Normal TM's bilaterally.  Normal auditory canals and external ears. Normal external nose, mucus membranes and septum.  Normal pharynx. Neck supple with no masses, nodes, nodules or enlargement. Cardiovascular - regularly irregular rhythm, S1, S2 normal and no S3 or S4 Lungs - breath sounds diffusely reduced, increased effort on expiration with  expiratory rales, right greater than left Abdomen - soft, non-tender; bowel sounds normal; no masses,  no organomegaly Extremities - no joint deformities, effusion, or  inflammation  Neurologic Examination: Mental Status: Alert, oriented to age but disoriented to current month, moderate respiratory distress.  Speech fluent without evidence of aphasia. Able to follow commands without difficulty. Cranial Nerves: II-Visual fields were normal. III/IV/VI-Pupils were equal and reacted normally to light. Extraocular movements were full and conjugate.    V/VII-reduced perception of tactile sensation; no facial weakness. VIII-normal. X-normal speech. XI: trapezius strength/neck flexion strength normal bilaterally XII-midline tongue extension with normal strength. Motor: 5/5 bilaterally with normal tone and bulk Sensory: Normal throughout. Deep Tendon Reflexes: Trace to 1+ and symmetric in upper extremities and absent in lower extremities. Plantars: Flexor bilaterally Cerebellar: Normal finger-to-nose testing. Carotid auscultation: Normal  Results for orders placed or performed during the hospital encounter of 08/20/15 (from the past 48 hour(s))  BMET in AM     Status: Abnormal   Collection Time: 08/24/15  6:15 AM  Result Value Ref Range   Sodium 144 135 - 145 mmol/L   Potassium 3.4 (L) 3.5 - 5.1 mmol/L   Chloride 107 101 - 111 mmol/L   CO2 30 22 - 32 mmol/L   Glucose, Bld 82 65 - 99 mg/dL   BUN 20 6 - 20 mg/dL   Creatinine, Ser 0.79 0.61 - 1.24 mg/dL   Calcium 9.2 8.9 - 10.3 mg/dL   GFR calc non Af Amer >60 >60 mL/min   GFR calc Af Amer >60 >60 mL/min    Comment: (NOTE) The eGFR has been calculated using the CKD EPI equation. This calculation has not been validated in all clinical situations. eGFR's persistently <60 mL/min signify possible Chronic Kidney Disease.    Anion gap 7 5 - 15  Glucose, capillary     Status: Abnormal   Collection Time: 08/24/15  7:54 AM  Result Value Ref Range   Glucose-Capillary 110 (H) 65 - 99 mg/dL  Glucose, capillary     Status: Abnormal   Collection Time: 08/24/15 12:55 PM  Result Value Ref Range    Glucose-Capillary 100 (H) 65 - 99 mg/dL  Glucose, capillary     Status: None   Collection Time: 08/24/15  4:23 PM  Result Value Ref Range   Glucose-Capillary 94 65 - 99 mg/dL  Glucose, capillary     Status: Abnormal   Collection Time: 08/24/15  7:43 PM  Result Value Ref Range   Glucose-Capillary 125 (H) 65 - 99 mg/dL   Comment 1 Notify RN   I-STAT 3, arterial blood gas (G3+)     Status: Abnormal   Collection Time: 08/24/15  8:45 PM  Result Value Ref Range   pH, Arterial 7.337 (L) 7.350 - 7.450   pCO2 arterial 58.1 (HH) 35.0 - 45.0 mmHg   pO2, Arterial 69.0 (L) 80.0 - 100.0 mmHg   Bicarbonate 31.1 (H) 20.0 - 24.0 mEq/L   TCO2 33 0 - 100 mmol/L   O2 Saturation 92.0 %   Acid-Base Excess 4.0 (H) 0.0 - 2.0 mmol/L   Patient temperature 98.6 F    Collection site RADIAL, ALLEN'S TEST ACCEPTABLE    Drawn by Operator    Sample type ARTERIAL    Comment NOTIFIED PHYSICIAN   Glucose, capillary     Status: Abnormal   Collection Time: 08/25/15 12:00 AM  Result Value Ref Range   Glucose-Capillary 139 (H) 65 - 99 mg/dL   Comment 1 Notify RN  Urinalysis, Routine w reflex microscopic (not at [facility])     Status: None   Collection Time: 08/25/15  2:15 AM  Result Value Ref Range   Color, Urine YELLOW YELLOW   APPearance CLEAR CLEAR   Specific Gravity, Urine 1.011 1.005 - 1.030   pH 6.0 5.0 - 8.0   Glucose, UA NEGATIVE NEGATIVE mg/dL   Hgb urine dipstick NEGATIVE NEGATIVE   Bilirubin Urine NEGATIVE NEGATIVE   Ketones, ur NEGATIVE NEGATIVE mg/dL   Protein, ur NEGATIVE NEGATIVE mg/dL   Nitrite NEGATIVE NEGATIVE   Leukocytes, UA NEGATIVE NEGATIVE    Comment: MICROSCOPIC NOT DONE ON URINES WITH NEGATIVE PROTEIN, BLOOD, LEUKOCYTES, NITRITE, OR GLUCOSE <1000 mg/dL.  Magnesium     Status: None   Collection Time: 08/25/15  3:06 AM  Result Value Ref Range   Magnesium 2.0 1.7 - 2.4 mg/dL  CBC with Differential/Platelet     Status: Abnormal   Collection Time: 08/25/15  3:06 AM  Result Value Ref Range    WBC 10.1 4.0 - 10.5 K/uL   RBC 5.12 4.22 - 5.81 MIL/uL   Hemoglobin 15.4 13.0 - 17.0 g/dL   HCT 48.6 39.0 - 52.0 %   MCV 94.9 78.0 - 100.0 fL   MCH 30.1 26.0 - 34.0 pg   MCHC 31.7 30.0 - 36.0 g/dL   RDW 14.5 11.5 - 15.5 %   Platelets 147 (L) 150 - 400 K/uL   Neutrophils Relative % 74 %   Neutro Abs 7.6 1.7 - 7.7 K/uL   Lymphocytes Relative 16 %   Lymphs Abs 1.6 0.7 - 4.0 K/uL   Monocytes Relative 9 %   Monocytes Absolute 0.9 0.1 - 1.0 K/uL   Eosinophils Relative 1 %   Eosinophils Absolute 0.1 0.0 - 0.7 K/uL   Basophils Relative 0 %   Basophils Absolute 0.0 0.0 - 0.1 K/uL  Renal function panel     Status: Abnormal   Collection Time: 08/25/15  3:06 AM  Result Value Ref Range   Sodium 144 135 - 145 mmol/L   Potassium 3.2 (L) 3.5 - 5.1 mmol/L   Chloride 108 101 - 111 mmol/L   CO2 31 22 - 32 mmol/L   Glucose, Bld 85 65 - 99 mg/dL   BUN 12 6 - 20 mg/dL   Creatinine, Ser 0.73 0.61 - 1.24 mg/dL   Calcium 9.0 8.9 - 10.3 mg/dL   Phosphorus 2.8 2.5 - 4.6 mg/dL   Albumin 3.1 (L) 3.5 - 5.0 g/dL   GFR calc non Af Amer >60 >60 mL/min   GFR calc Af Amer >60 >60 mL/min    Comment: (NOTE) The eGFR has been calculated using the CKD EPI equation. This calculation has not been validated in all clinical situations. eGFR's persistently <60 mL/min signify possible Chronic Kidney Disease.    Anion gap 5 5 - 15  Glucose, capillary     Status: None   Collection Time: 08/25/15  4:12 AM  Result Value Ref Range   Glucose-Capillary 93 65 - 99 mg/dL  Glucose, capillary     Status: Abnormal   Collection Time: 08/25/15  8:35 AM  Result Value Ref Range   Glucose-Capillary 104 (H) 65 - 99 mg/dL  Glucose, capillary     Status: Abnormal   Collection Time: 08/25/15 11:34 AM  Result Value Ref Range   Glucose-Capillary 116 (H) 65 - 99 mg/dL  Glucose, capillary     Status: Abnormal   Collection Time: 08/25/15  4:27 PM  Result Value  Ref Range   Glucose-Capillary 102 (H) 65 - 99 mg/dL  Blood gas,  arterial     Status: Abnormal   Collection Time: 08/26/15 12:00 AM  Result Value Ref Range   FIO2 0.35    Delivery systems NASAL CANNULA    pH, Arterial 7.200 (L) 7.350 - 7.450   pCO2 arterial 87.0 (HH) 35.0 - 45.0 mmHg    Comment: CRITICAL RESULT CALLED TO, READ BACK BY AND VERIFIED WITH: BROOKE WHITE RN AT 0003 BY FRANCIS ALMONOR RRT,RCP ON 08/26/2015    pO2, Arterial 107 (H) 80.0 - 100.0 mmHg   Bicarbonate 32.7 (H) 20.0 - 24.0 mEq/L   TCO2 35.4 0 - 100 mmol/L   Acid-Base Excess 5.1 (H) 0.0 - 2.0 mmol/L   O2 Saturation 97.0 %   Patient temperature 98.6    Collection site RIGHT RADIAL    Drawn by [id number]    Sample type ARTERIAL DRAW    Allens test (pass/fail) PASS PASS  I-STAT 3, arterial blood gas (G3+)     Status: Abnormal   Collection Time: 08/26/15  4:01 AM  Result Value Ref Range   pH, Arterial 7.309 (L) 7.350 - 7.450   pCO2 arterial 64.5 (HH) 35.0 - 45.0 mmHg   pO2, Arterial 69.0 (L) 80.0 - 100.0 mmHg   Bicarbonate 32.6 (H) 20.0 - 24.0 mEq/L   TCO2 35 0 - 100 mmol/L   O2 Saturation 91.0 %   Acid-Base Excess 4.0 (H) 0.0 - 2.0 mmol/L   Patient temperature 98.0 F    Collection site RADIAL, ALLEN'S TEST ACCEPTABLE    Drawn by RT    Sample type ARTERIAL    Comment NOTIFIED PHYSICIAN    Dg Chest Port 1 View  08/26/2015  CLINICAL DATA:  Respiratory distress with shortness of breath. EXAM: PORTABLE CHEST 1 VIEW COMPARISON:  08/22/2015 FINDINGS: Shallow inspiration. Mild cardiac enlargement with mild vascular congestion. Infiltration or atelectasis in the left lung base appears to be increasing since previous study. No pneumothorax. Tortuous aorta. Appliances have been removed since previous study. IMPRESSION: Shallow inspiration with developing infiltration or atelectasis in the left lung base, increasing since prior study. Electronically Signed   By: Lucienne Capers M.D.   On: 08/26/2015 00:43    Assessment/Plan 82 year old man with exacerbation of COPD with altered mental  status with transient unresponsiveness, likely related to hypercarbia. He is also somewhat confused and has had episodes of agitation, as well. Ammonia level was elevated earlier during his admission, and repeat study is pending. There is no documentation of underlying dementia.  Recommendations: 1. CT scan of the head without contrast when respiratory status is stable 2. EEG, routine adult study 3. Vitamin B 12 and folate levels, RPR and TSH  We will continue to follow this patient with you.  C.R. Nicole Kindred, MD [facility] [phone]  08/26/2015, 4:49 AM

## 2015-08-26 NOTE — Progress Notes (Signed)
PULMONARY / CRITICAL CARE MEDICINE   Name: Sean Arellano MRN: [id number] DOB: 19-Nov-1929    ADMISSION DATE:  08/20/2015 CONSULTATION DATE:  08/20/15  REFERRING MD:  Dr. Patria Mane EDP  CHIEF COMPLAINT:  AMS BRIEF 82 year old male with PMH as below, which is significant for COPD and recent admission to [facility] for hypercarbic respiratory failure. He presented to [facility] ED 7/5 with son who reports altered mental status for 12 hours prior to arrival. Per son this is a very similar presentation to when he was admitted to [facility]. Patient denies any significant cough, fevers, chills, or chest pain. Venous blood gas in the emergency department with pH 7.29 and CO2 95. He was placed on BiPAP, however, blood gas only worsened. CXR with no evidence of acute process. Due to lack of improvement on BiPAP, PCCM has been asked to admit.   MICROBIOLOGY: MRSA PCR 7/5:  Negative Blood Ctx x2 7/6>> Urine Strep Ag 7/7:  Negative Urine Legionella Ag 7/7>> Respiratory Panel PCR 7/7:  Negative  Tracheal Asp Ctx 7/7>>  ANTIBIOTICS: Levaquin 7/6>>  LINES/TUBES: OETT 7.5 7/6 - 7/8 OGT 7/6 - 7/8 FOLEY 7/6>> PIV x1  SIGNIFICANT EVENTS: 7/5 - Admitted to ICU on BiPAP 7/6 - Intubated for respiratory insufficiency 7/8 - Extubated  7/9 -  No acute events overnight. Extubated yesterday. Patient reports nausea has resolved. Reports dyspnea improving. Denies any cough. Denies any abdominal pain or pressure. Patient denies any chest pain or pressure.    SUBJECTIVE/OVERNIGHT/INTERVAL HX - PCCM recalled  7/9 - mild overnight delirium and low dose haldol and nictone patch started  7/10 - triad assumed primary. Overnight sundowning delirium +. In morning rounds - confusion noticed again and felt to be multifactorial  08/26/15 - moved to SDU for worsening hypercarbia -> Rx bipap/ and restart nebs but subsequently became unresponsive and PCCM called. Within several minues of PCCM arrival - he did arouse and started following  commands and mostly oriented and moved all 4 with good strentht  VITAL SIGNS: BP 117/69 mmHg  Pulse 72  Temp(Src) 97.9 F (36.6 C) (Axillary)  Resp 18  Ht 6' (1.829 m)  Wt 108.8 kg (239 lb 13.8 oz)  BMI 32.52 kg/m2  SpO2 98%  HEMODYNAMICS:    VENTILATOR SETTINGS:    INTAKE / OUTPUT: I/O last 3 completed shifts: In: 2700 [P.O.:1080; I.V.:1070; IV Piggyback:550] Out: 2275 [Urine:2275]  PHYSICAL EXAMINATION: General: eldeerly male Neuro: Grossly nonfocal. Cranial nerves grossly intact. Moving all 4 extremities equally. Oriented to person, year, & president  & place (got month wrong)  HEENT:On and off bipap No scleral icterus or injection. Cardiovascular: Tachycardic with regular rhythm. No edema. No appreciable JVD. Lungs: Improving aeration bilaterally. Mild coarse wheeze. Normal work of breathing on nasal cannula oxygen. Abdomen: Soft. Protuberant. Nontender. Musculoskeletal: No acute joint deformity or effusion. Skin: Warm & dry. No rash on exposed skin.  LABS:  PULMONARY  Recent Labs Lab 08/21/15 1455 08/22/15 0304 08/24/15 2045 08/26/15 08/26/15 0401  PHART 7.547* 7.507* 7.337* 7.200* 7.309*  PCO2ART 39.4 41.3 58.1* 87.0* 64.5*  PO2ART 60.7* 74.0* 69.0* 107* 69.0*  HCO3 34.4* 32.5* 31.1* 32.7* 32.6*  TCO2 35.7 33.7 33 35.4 35  O2SAT 95.1 96.0 92.0 97.0 91.0    CBC  Recent Labs Lab 08/23/15 0548 08/24/15 0224 08/25/15 0306  HGB 17.1* 16.1 15.4  HCT 52.9* 50.9 48.6  WBC 13.1* 13.2* 10.1  PLT 148* 143* 147*    COAGULATION No results for input(s): INR in the last  168 hours.  CARDIAC   Recent Labs Lab 08/20/15 1851 08/20/15 2059 08/21/15 0240 08/21/15 0730 08/21/15 1252  TROPONINI 0.06* 0.06* 0.06* 0.06* 0.03*   No results for input(s): PROBNP in the last 168 hours.   CHEMISTRY  Recent Labs Lab 08/22/15 0248 08/22/15 1002 08/23/15 0548 08/24/15 0224 08/24/15 0615 08/25/15 0306  NA 141 143 143 142 144 144  K 3.2* 3.2*  3.5 3.3* 3.4* 3.2*  CL 102 104 110 107 107 108  CO2 30 31 26 29 30 31   GLUCOSE 175* 172* 120* 89 82 85  BUN 25* 27* 24* 21* 20 12  CREATININE 1.11 0.93 0.64 0.80 0.79 0.73  CALCIUM 9.5 9.2 9.3 9.1 9.2 9.0  MG 1.9  --  1.8 1.9  --  2.0  PHOS <1.0* 2.9 3.4 3.2  --  2.8   Estimated Creatinine Clearance: 87.6 mL/min (by C-G formula based on Cr of 0.73).   LIVER  Recent Labs Lab 08/20/15 1851 08/23/15 0548 08/24/15 0224 08/25/15 0306  AST 24  --   --   --   ALT 20  --   --   --   ALKPHOS 44  --   --   --   BILITOT 0.5  --   --   --   PROT 7.0  --   --   --   ALBUMIN 3.8 3.0* 3.1* 3.1*     INFECTIOUS  Recent Labs Lab 08/20/15 1851 08/22/15 1002 08/23/15 0548 08/24/15 0224  LATICACIDVEN 0.6  --   --   --   PROCALCITON  --  <0.10 <0.10 <0.10     ENDOCRINE CBG (last 3)   Recent Labs  08/25/15 0835 08/25/15 1134 08/25/15 1627  GLUCAP 104* 116* 102*         IMAGING x48h  - image(s) personally visualized  -   highlighted in bold Dg Chest Port 1 View  08/26/2015  CLINICAL DATA:  Respiratory distress with shortness of breath. EXAM: PORTABLE CHEST 1 VIEW COMPARISON:  08/22/2015 FINDINGS: Shallow inspiration. Mild cardiac enlargement with mild vascular congestion. Infiltration or atelectasis in the left lung base appears to be increasing since previous study. No pneumothorax. Tortuous aorta. Appliances have been removed since previous study. IMPRESSION: Shallow inspiration with developing infiltration or atelectasis in the left lung base, increasing since prior study. Electronically Signed   By: Burman NievesWilliam  Stevens M.D.   On: 08/26/2015 00:43       ASSESSMENT / PLAN:  PULMONARY A: #baseline  - copd nos + smoking  #this admit Acute on Chronic Hypercarbic Respiratory Failure - Secondary to COPD Exacerbation. S/p intuation 08/21/15 through 08/23/15  #Current - recurrent acute hypercapnic failure - follows overnight meds for delirium; improving slowly with  bipap   P:  Nicotine patch BiPAP Weaning FiO2 for Sat 88-94% to minimize VQ mismatching Continuing Duonebs QID Albuterol neb prn Change to solumderol Incentive Spirometry for Pulmonary Toilette duoneb restart Tobacco cessation education prior to discharge  CARDIOVASCULAR A:  #baseline - H/O HTN H/O Hyperlipidemia  #this admit   - Elevated Troponin I - Likely demand ischemia w/ hypoxia. Ef 65% on echo 59/17  #currently   - maintains bp/hr    P:  Daily fibrate Daily norvasc + catapres  RENAL A:  Hypokalemia  P:  Replete KCL 40 Trending UOP  D/C Foley Trending electrolytes & renal function daily Replace electrolytes as needed   GASTROINTESTINAL A:  Nausea - Resolved.   7/11 7 - nil acute  P:  NPO  D/C Pepcid Zofran IV prn  HEMATOLOGIC A:  Leukocytosis- Likely secondary to steroids. Stable. Polycythemia - Likely due to chronic hypoxia. Thrombocytopenia - Mild. Stable.  P:  Trending cell counts daily w/ CBC Lovenox [place] daily SCDs  INFECTIOUS A:  Possible Acute Bronchitis  - PCT negative P:  Levaquin Day #4 -> change to doxy IV x 1 more day on 7//17 (dc quinolone due to acute enceph)      ENDOCRINE A:  Hyperglycemia - Likely secondary to steroids. Controlled. Hgb A1c 5.6.  P:  Accu-Checks q4hr while NPO SSI per Moderate Algorithm  NEUROLOGIC A:  Acute Encephalopathy -  Likely multifactorial from hypercarbia, hypoxia, & toxic.    - 7/11/7  - seems to recur each night . Slowly better again with bipap and no focal deficits. Neuro canceled code stroke - d/w Dr Roseanne Reno  P:  Monitor closely. No sedating medications. - do precedex gtt if needed Bed rest Cancel Head CT per neuro  FAMILY UPDATE:  Son updated at bedside 7/9 by Dr. Jamison Neighbor. No family bedside 08/26/2015   TODAY'S SUMMARY:  AECOPD. S/p extubation. Ongoing issued with delirum at night -> meds > hypercarbia. Nearly got intubated. Now improved with  bipap. PCCM to re-round again 08/26/2015 and if well tx for TRH pickup 08/27/15      The patient is critically ill with multiple organ systems failure and requires high complexity decision making for assessment and support, frequent evaluation and titration of therapies, application of advanced monitoring technologies and extensive interpretation of multiple databases.   Critical Care Time devoted to patient care services described in this note is  45  Minutes. This time reflects time of care of this signee Dr Kalman Shan. This critical care time does not reflect procedure time, or teaching time or supervisory time of PA/NP/Med student/Med Resident etc but could involve care discussion time    Dr. Kalman Shan, M.D., [facility].C.P Pulmonary and Critical Care Medicine Staff Physician  System [place] [facility] Pager: [phone], If no answer or between  15:00h - 7:00h: call [phone]  08/26/2015 4:30 AM

## 2015-08-26 NOTE — Progress Notes (Signed)
RN to patients room at 0330 for reassessment, patient not waking up to voice.  RN increased volume of voice and began sternal rubs, patient opening eyes but only briefly.  Checked patient's CBG, 101.  Called respiratory therapy and eLink, spoke with MD Jamison NeighborNestor and informed of patient's condition.  MD Nestor camera'd in to room to assess patient and requested ABG.   ABG obtained, MD Jamison NeighborNestor reviewed results and code stroke was called at [facility]0406

## 2015-08-26 NOTE — Progress Notes (Signed)
LB PCCM  S: Brought back to ICU overnight, was encephalopathic but then woke up on BIPAP, code stroke initially called then canceled.  Ceasar Mons:  Filed Vitals:   08/26/15 0744 08/26/15 0800 08/26/15 0840 08/26/15 0842  BP:  138/71    Pulse:  133  79  Temp: 97.6 F (36.4 C)     TempSrc: Axillary     Resp:  26  24  Height:      Weight:      SpO2:  100% 100% 100%   On BIPAP, FiO2 50%  Gen: sleepy in bed HENT: NCAT, BIPAP mask in place PULM: Poor air movement, no wheezing CV: brady, regular GI: BS+, soft, nontender Neuro: very drowsy, took vigorous touch to wake up, he then followed commands and was conversant but quickly fell back asleep  7/9 Echo> LVEF 60-65%, RV size/function normal, valves OK, LA severely dilated  CBC> Hct 53; BMET OK  CXR: images reviewed, atelectasis bases increased compared to prior, perhaps increased vascular congestion compared to prior  Impression/Plan: Acute encephalopathy> primarily related to hypercarbia but also some degree of senile sundowning;  > wean down FiO2 now as O2 saturation so high it is contributing to hypercarbia > wean solumedrol  Acute on chronic respiratory failure with hypercapnea with COPD; labs suggestive of baseline hypercarbia, COPD related? Neuromuscular weakness? > make BIPAP prn daytime, but continue BIPAP qHS > after stopping BIPAP, will place on nasal canula with capnography > add brovana > titrate O2 to O2 saturation of 88%  Acute pulmonary edema? Doubt based on exam > hold further lasix  Maintain in ICU  Additional CC time 35 minutes  Heber CarolinaBrent McQuaid, MD [place] PCCM Pager: [phone] Cell: [phone] After 3pm or if no response, call [phone]

## 2015-08-26 NOTE — Progress Notes (Signed)
eLink Physician-Brief Progress Note Patient Name: Sean ConchJames Rayman DOB: [birth year] MRN: [id number]   Date of Service  08/26/2015  HPI/Events of Note  Camera check on patient after transfer to stepdown unit. Patient now more awake and alert. Nodding appropriately. Nurse at bedside. Currently on BiPAP therapy.   eICU Interventions  1. Continue BiPAP 2. Continue scheduled nebulizer therapy 3. Remainder of plan of care per primary service      Intervention Category Major Interventions: Respiratory failure - evaluation and management  Lawanda CousinsJennings Nestor 08/26/2015, 1:58 AM

## 2015-08-26 NOTE — Progress Notes (Signed)
Code stroke called 0406, pt unresponsive and pin point pupils. NIH 27 PCCM at bedside to intubate, prior to intubation pt awake, following commands, alert, intubation cancelled. CT scan to be done routine later today per Dr. Roseanne RenoStewart at bedside. Repeat NIH of 2 @ 0430. Code stroke cancelled.

## 2015-08-27 LAB — GLUCOSE, CAPILLARY
Glucose-Capillary: 178 mg/dL — ABNORMAL HIGH (ref 65–99)
Glucose-Capillary: 87 mg/dL (ref 65–99)
Glucose-Capillary: 116 mg/dL — ABNORMAL HIGH (ref 65–99)

## 2015-08-27 LAB — BASIC METABOLIC PANEL
Anion gap: 7 (ref 5–15)
BUN: 15 mg/dL (ref 6–20)
CO2: 33 mmol/L — ABNORMAL HIGH (ref 22–32)
Calcium: 8.5 mg/dL — ABNORMAL LOW (ref 8.9–10.3)
Chloride: 102 mmol/L (ref 101–111)
Creatinine, Ser: 0.81 mg/dL (ref 0.61–1.24)
GFR calc Af Amer: >60 mL/min (ref >60)
GFR calc non Af Amer: >60 mL/min (ref >60)
Glucose, Bld: 87 mg/dL (ref 65–99)
Potassium: 3.9 mmol/L (ref 3.5–5.1)
Sodium: 142 mmol/L (ref 135–145)

## 2015-08-27 MED ORDER — PREDNISONE 20 MG PO TABS
40.0000 mg | ORAL_TABLET | Freq: Every day | ORAL | Status: DC
  Administered 2015-08-28 – 2015-08-29 (×2): 40 mg via ORAL
  Filled 2015-08-27 (×2): qty 2

## 2015-08-27 NOTE — Progress Notes (Signed)
Inpatient Diabetes Program Recommendations  AACE/ADA: New Consensus Statement on Inpatient Glycemic Control (2015)  Target Ranges:  Prepandial:   less than 140 mg/dL      Peak postprandial:   less than 180 mg/dL (1-2 hours)      Critically ill patients:  140 - 180 mg/dL   Lab Results  Component Value Date   GLUCAP 178* 08/27/2015   HGBA1C 5.6 08/22/2015    Review of Glycemic Control:  Results for Sean Arellano, Sean Arellano (MRN [id number]) as of 08/27/2015 10:57  Ref. Range 08/26/2015 15:44 08/26/2015 21:42 08/26/2015 22:14 08/27/2015 09:21  Glucose-Capillary Latest Ref Range: 65-99 mg/dL 409204 (H) 66 97 811178 (H)   Diabetes history: No history of diabetes noted Outpatient Diabetes medications: None Current orders for Inpatient glycemic control:  Novolog 2-4-6 q 4 hours  Inpatient Diabetes Program Recommendations:    Note that patient is on PO diet.  Please d/c ICU glycemic control protocol and add Novolog sensitive tid with meals and HS.   Thanks, Beryl MeagerJenny Yazmeen Woolf, RN, BC-ADM Inpatient Diabetes Coordinator Pager [phone] (8a-5p)

## 2015-08-27 NOTE — Progress Notes (Signed)
PULMONARY / CRITICAL CARE MEDICINE   Name: Sean Arellano MRN: [id number] DOB: 1929/04/01    ADMISSION DATE:  08/20/2015 CONSULTATION DATE:  08/20/15  REFERRING MD:  Dr. Patria Mane EDP  CHIEF COMPLAINT:  AMS  BRIEF 82 year old male with acute on chronic hypercarbic respiratory failure.   MICROBIOLOGY: MRSA PCR 7/5:  Negative Blood Ctx x2 7/6>> Urine Strep Ag 7/7:  Negative Urine Legionella Ag 7/7>> Respiratory Panel PCR 7/7:  Negative  Tracheal Asp Ctx 7/7>>  ANTIBIOTICS: Levaquin 7/6>>  LINES/TUBES: OETT 7.5 7/6 - 7/8 OGT 7/6 - 7/8 FOLEY 7/6>> PIV x1  SIGNIFICANT EVENTS: 7/5 - Admitted to ICU on BiPAP 7/6 - Intubated for respiratory insufficiency 7/8 - Extubated  7/9 -  No acute events overnight. Extubated yesterday. Patient reports nausea has resolved. Reports dyspnea improving. Denies any cough. Denies any abdominal pain or pressure. Patient denies any chest pain or pressure. 7/10 - triad assumed primary. Overnight sundowning delirium +. In morning rounds - confusion noticed again and felt to be multifactorial 7/11 brought back to ICU for acute on chronic hypercarbic respiratory failure causing encephalopathy> treated with BIPAP and recovered completely    SUBJECTIVE:  Feels much better Slept well Says he has not been using his home BIPAP machine for a few weeks  VITAL SIGNS: BP 118/70 mmHg  Pulse 103  Temp(Src) 98.3 F (36.8 C) (Oral)  Resp 15  Ht 6' (1.829 m)  Wt 232 lb (105.235 kg)  BMI 31.46 kg/m2  SpO2 97%  HEMODYNAMICS:    VENTILATOR SETTINGS:    INTAKE / OUTPUT: I/O last 3 completed shifts: In: 1642 [P.O.:840; I.V.:102; IV Piggyback:700] Out: 1290 [Urine:1290]  PHYSICAL EXAMINATION: General: resting comfortably in bed HENT: NCAT OP clear PULM: improved air movement today, no wheezing CV: RRR, no mgr GI: BS+, soft, nontender MSK: Normal bulk and tone Neuro: Awake and alert, no distress  LABS:  PULMONARY  Recent Labs Lab  08/21/15 1455 08/22/15 0304 08/24/15 2045 08/26/15 08/26/15 0401  PHART 7.547* 7.507* 7.337* 7.200* 7.309*  PCO2ART 39.4 41.3 58.1* 87.0* 64.5*  PO2ART 60.7* 74.0* 69.0* 107* 69.0*  HCO3 34.4* 32.5* 31.1* 32.7* 32.6*  TCO2 35.7 33.7 33 35.4 35  O2SAT 95.1 96.0 92.0 97.0 91.0    CBC  Recent Labs Lab 08/25/15 0306 08/26/15 0420 08/26/15 0425  HGB 15.4 16.2 16.2  HCT 48.6 52.9* 53.1*  WBC 10.1 9.0 9.1  PLT 147* 148* 160    COAGULATION No results for input(s): INR in the last 168 hours.  CARDIAC    Recent Labs Lab 08/21/15 0730 08/21/15 1252 08/26/15 0554 08/26/15 1117 08/26/15 1645  TROPONINI 0.06* 0.03* 0.06* 0.06* 0.05*   No results for input(s): PROBNP in the last 168 hours.   CHEMISTRY  Recent Labs Lab 08/22/15 0248 08/22/15 1002 08/23/15 0548 08/24/15 0224 08/24/15 0615 08/25/15 0306 08/26/15 0420 08/26/15 0425 08/27/15 0327  NA 141 143 143 142 144 144 144 143 142  K 3.2* 3.2* 3.5 3.3* 3.4* 3.2* 4.4 4.5 3.9  CL 102 104 110 107 107 108 104 105 102  CO2 32 33*  GLUCOSE 175* 172* 120* 89 82 85 101* 97 87  BUN 25* 27* 24* 21* CREATININE 1.11 0.93 0.64 0.80 0.79 0.73 0.96 0.92 0.81  CALCIUM 9.5 9.2 9.3 9.1 9.2 9.0 9.1 8.9 8.5*  MG 1.9  --  1.8 1.9  --  2.0  --   --   --  PHOS <1.0* 2.9 3.4 3.2  --  2.8  --   --   --    Estimated Creatinine Clearance: 85.1 mL/min (by C-G formula based on Cr of 0.81).   LIVER  Recent Labs Lab 08/20/15 1851 08/23/15 0548 08/24/15 0224 08/25/15 0306 08/26/15 0420  AST 24  --   --   --  31  ALT 20  --   --   --  63  ALKPHOS 44  --   --   --  40  BILITOT 0.5  --   --   --  1.1  PROT 7.0  --   --   --  6.4*  ALBUMIN 3.8 3.0* 3.1* 3.1* 3.3*     INFECTIOUS  Recent Labs Lab 08/20/15 1851  08/23/15 0548 08/24/15 0224 08/26/15 0420 08/26/15 0431  LATICACIDVEN 0.6  --   --   --   --  1.1  PROCALCITON  --   < > <0.10 <0.10 <0.10  --   < > = values in this interval  not displayed.   ENDOCRINE CBG (last 3)   Recent Labs  08/26/15 2142 08/26/15 2214 08/27/15 0921  GLUCAP 66 97 178*      Dg Chest Port 1 View  08/26/2015  CLINICAL DATA:  Respiratory distress with shortness of breath. EXAM: PORTABLE CHEST 1 VIEW COMPARISON:  08/22/2015 FINDINGS: Shallow inspiration. Mild cardiac enlargement with mild vascular congestion. Infiltration or atelectasis in the left lung base appears to be increasing since previous study. No pneumothorax. Tortuous aorta. Appliances have been removed since previous study. IMPRESSION: Shallow inspiration with developing infiltration or atelectasis in the left lung base, increasing since prior study. Electronically Signed   By: Burman NievesWilliam  Stevens M.D.   On: 08/26/2015 00:43       ASSESSMENT / PLAN:  PULMONARY A: Acute on chronic respiratory failure with hypercarbia AE COPD Baseline BIPAP non-compliance Tobacco abuse  P:  Use home BIPAP tonight, monitor carefully for hypercarbia Continue brovana and pulmicort Change to prednisone taper   CARDIOVASCULAR A:  Hypertension Elevated Troponin I - Likely demand ischemia w/ hypoxia. Ef 65% on echo 59/17  P:  Daily fibrate Daily norvasc + catapres Tele   RENAL A:  No acute issues  P:  Monitor BMET and UOP Replace electrolytes as needed   GASTROINTESTINAL A:  Nausea - Resolved  P:  Regular diet  HEMATOLOGIC A:  Leukocytosis- Likely secondary to steroids. Stable Polycythemia - Likely due to chronic hypoxia Thrombocytopenia - Mild. Stable  P:  Monitor for bleeding  INFECTIOUS A:  Acute Bronchitis  P:  Stop antibiotics   ENDOCRINE A:  Hyperglycemia - Likely secondary to steroids. Controlled. Hgb A1c 5.6 P:  Accu-Checks q4hr while NPO SSI per Moderate Algorithm  NEUROLOGIC A:  Acute Encephalopathy -  Likely multifactorial from hypercarbia, hypoxia, & medication related   P:  No sedating meds   FAMILY  UPDATE:  Son updated by phone 7/11 by Dr. Kendrick FriesMcQuaid. No family bedside 80/01/2016   TODAY'S SUMMARY:  Acute on chronic respiratory failure with hypercarbia, improving.  Plan to start BIPAP with home device tonight if his family can bring it in.  If stable, could likely go home 7/13 or 7/14.   Heber CarolinaBrent Isaias Dowson, MD [place] PCCM Pager: [phone] Cell: [phone] After 3pm or if no response, call [phone]   08/27/2015 10:46 AM

## 2015-08-27 NOTE — Progress Notes (Signed)
Nutrition Follow-up  DOCUMENTATION CODES:   Obesity unspecified  INTERVENTION:   Continue to encourage meals and snacks.   NUTRITION DIAGNOSIS:   Inadequate oral intake related to inability to eat as evidenced by NPO status. Resolved.   GOAL:   Patient will meet greater than or equal to 90% of their needs Met.   MONITOR:   PO intake, I & O's  ASSESSMENT:   82 year old male with COPD presented with AMS. In ED found to have hypercarbic failure and started on BiPAP. Required intubation 7/6.  7/8 extubated 7/11 diet started, meal completion 100% Pt reports good appetite.   Labs and meds reviewed  Diet Order:  Diet heart healthy/carb modified Room service appropriate?: Yes; Fluid consistency:: Thin  Skin:  Reviewed, no issues  Last BM:  7/11  Height:   Ht Readings from Last 1 Encounters:  08/26/15 6' (1.829 m)    Weight:   Wt Readings from Last 1 Encounters:  08/27/15 232 lb (105.235 kg)    Ideal Body Weight:  80.9 kg  BMI:  Body mass index is 31.46 kg/(m^2).  Estimated Nutritional Needs:   Kcal:  2000-2200  Protein:  100-115 grams  Fluid:  1.8-2 L  EDUCATION NEEDS:   No education needs identified at this time  [place], [place], [place] Pager [phone] After Hours Pager

## 2015-08-28 LAB — GLUCOSE, CAPILLARY
GLUCOSE-CAPILLARY: 110 mg/dL — AB (ref 65–99)
GLUCOSE-CAPILLARY: 90 mg/dL (ref 65–99)
Glucose-Capillary: 163 mg/dL — ABNORMAL HIGH (ref 65–99)

## 2015-08-28 LAB — BASIC METABOLIC PANEL
ANION GAP: 6 (ref 5–15)
BUN: 16 mg/dL (ref 6–20)
CO2: 34 mmol/L — ABNORMAL HIGH (ref 22–32)
Calcium: 8.9 mg/dL (ref 8.9–10.3)
Chloride: 101 mmol/L (ref 101–111)
Creatinine, Ser: 0.91 mg/dL (ref 0.61–1.24)
GFR calc Af Amer: >60 mL/min (ref >60)
Glucose, Bld: 88 mg/dL (ref 65–99)
POTASSIUM: 3.7 mmol/L (ref 3.5–5.1)
SODIUM: 141 mmol/L (ref 135–145)

## 2015-08-28 MED ORDER — FLUTICASONE FUROATE-VILANTEROL 100-25 MCG/INH IN AEPB
1.0000 | INHALATION_SPRAY | Freq: Every day | RESPIRATORY_TRACT | Status: DC
  Administered 2015-08-28 – 2015-08-29 (×2): 1 via RESPIRATORY_TRACT
  Filled 2015-08-28: qty 28

## 2015-08-28 MED ORDER — SODIUM CHLORIDE 0.9 % IV SOLN: INTRAVENOUS | Status: DC | PRN

## 2015-08-28 MED ORDER — IPRATROPIUM-ALBUTEROL 0.5-2.5 (3) MG/3ML IN SOLN: 3.0000 mL | RESPIRATORY_TRACT | Status: DC | PRN

## 2015-08-28 MED ORDER — TIOTROPIUM BROMIDE MONOHYDRATE 18 MCG IN CAPS
18.0000 ug | ORAL_CAPSULE | Freq: Every day | RESPIRATORY_TRACT | Status: DC
  Administered 2015-08-28 – 2015-08-29 (×2): 18 ug via RESPIRATORY_TRACT
  Filled 2015-08-28: qty 5

## 2015-08-28 NOTE — Progress Notes (Signed)
CSW spoke with pt regarding PT recommendation for SNF.  Pt states that he would like to go home- states he lives with 2 grandsons and has home aid that comes in as well as Meals on Wheels- [facility]  RNCM informed- CSW signing off  Merlyn LotJenna Holoman, [place] Clinical Social Worker [phone]

## 2015-08-28 NOTE — Procedures (Signed)
EEG Report  Clinical History:  Altered mental status in COPD patient highly elevated PCO2 and low pH.    Technical Summary:  A 19 channel digital EEG was performed using the 10-20 international system of electrode placement.  Bipolar and referential montages were used.  Total recording time was approx 24 minutes.  Findings:  There is a posterior dominant rhythm of 8 Hz.  Both hemispheres are symmetrical and there is no focal slowing present.  Sleep was not recorded.  There were no epileptiform discharges or electrographic seizures present.    Impression:  This is a normal EEG in the awake state.     Weston SettleShervin Kadence Mikkelson, MS, MD

## 2015-08-28 NOTE — Discharge Summary (Signed)
Physician Discharge Summary  Patient ID: Sean Arellano MRN: [id number] DOB/AGE: 80-May-1931 80 y.o.  Admit date: 08/20/2015 Discharge date: 08/29/2015    Discharge Diagnoses:  Active Problems:   Hypertension   COPD exacerbation (HCC)   Respiratory failure with hypercapnia (HCC)   Acute respiratory failure with hypoxia and hypercarbia (HCC) Physical deconditioning                                                       D/c plan by Discharge Diagnosis  Acute on chronic respiratory failure with hypercarbia AE COPD Baseline BIPAP non-compliance Tobacco abuse Acute bronchitis  P:  qhs bipap -->he was notified that he needs to bring his home machine into the store he purchased it from.  Will f/u with [place] on 7/20; we can schedule formal pulmonary f/u w/ Dr Ashok Cordia at that point  Continue symbicort, spiriva, PRN albuterol  PO prednisone with taper     Hypertension Elevated Troponin I - Likely demand ischemia w/ hypoxia. Ef 65% on echo 59/17 P:  Daily fibrate Daily norvasc + catapres PCP f/u    Leukocytosis- Likely secondary to steroids. Stable Polycythemia - Likely due to chronic hypoxia Thrombocytopenia - Mild. Stable P:  outpt PCP f/u   Physical deconditioning P: Out pt PT at home   Acute Encephalopathy - Likely multifactorial from hypercarbia, hypoxia, & medication related. Resolved.  P:  outpt PCP f/u  Limit sedating medications  qhs bipap as above    Brief Summary: Sean Arellano is a 82 y.o. y/o male with a PMH of COPD with recent admission to [facility] for hypercarbic respiratory failure. Pt has hx of qHS bipap but had not worn this in several weeks per family.  He presented to [facility] ED 7/5 with AMS.  Venous blood gas in the emergency department with pH 7.29 and CO2 95. He was placed on BiPAP, however, blood gas worsened and he required intubation 7/6.  He was treated for acute bronchitis and COPD exacerbation with nebulized BD's, IV steroids and IV abx.  He  improved and was extubated 7/8.   Course was c/b elevated troponin likely r/t demand ischemia in setting respiratory failure as well as AMS/delirium.  He was tx out of ICU 7/9.  However overnight he developed worsening agitation and confusion 7/10.  On 7/11 he had acute AMS and was noted to be minimally responsive by RN.  ABG at that time revealed hypercarbia with Ph 7.2, pCo2 87.  He was tx back to ICU and placed on bipap and mental status improved quickly. He was seen in consultation by  He was maintained on qhs bipap thereafter and has tolerated this well.  Attempts were made to obtain his home bipap to ensure tolerance prior to d/c home. On 7/13 he is tolerating qhs bipap, respiratory status stable and near baseline.  He has transitioned off abx and to PO prednisone with taper.  At the time of d/c he is ambulatory, eating well, at baseline respiratory status and tolerating home 2L .  He is medically cleared for discharge with plans as above.    MICROBIOLOGY: MRSA PCR 7/5: Negative Blood Ctx x2 7/6>> Urine Strep Ag 7/7: Negative Urine Legionella Ag 7/7>> Respiratory Panel PCR 7/7: Negative  Tracheal Asp Ctx 7/7>>  ANTIBIOTICS: Levaquin 7/6>>  LINES/TUBES: OETT 7.5 7/6 - 7/8 OGT  7/6 - 7/8 FOLEY 7/6>>out PIV x1  SIGNIFICANT EVENTS: 7/5 - Admitted to ICU on BiPAP 7/6 - Intubated for respiratory insufficiency 7/8 - Extubated  7/9 - No acute events overnight. Extubated yesterday. Patient reports nausea has resolved. Reports dyspnea improving. Denies any cough. Denies any abdominal pain or pressure. Patient denies any chest pain or pressure. 7/10 - triad assumed primary. Overnight sundowning delirium +. In morning rounds - confusion noticed again and felt to be multifactorial 7/11 brought back to ICU for acute on chronic hypercarbic respiratory failure causing encephalopathy> treated with BIPAP and recovered completely    Filed Vitals:   08/29/15 0500 08/29/15 0823 08/29/15 0956  08/29/15 1116  BP:  147/86 138/50   Pulse:  73 79   Temp:  98 F (36.7 C) 98.2 F (36.8 C)   TempSrc:  Oral Oral   Resp:  15    Height:      Weight: 233 lb 11 oz (106 kg)     SpO2:  96% 92% 97%     Discharge Labs  BMET  Recent Labs Lab 08/23/15 0548 08/24/15 0224  08/25/15 0306 08/26/15 0420 08/26/15 0425 08/27/15 0327 08/28/15 0219 08/29/15 0244  NA 143 142  < > 144 144 143 142 141 140  K 3.5 3.3*  < > 3.2* 4.4 4.5 3.9 3.7 3.8  CL 110 107  < > 108 104 105 102 101 101  CO2 26 29  < > 31 31 32 33* 34* 31  GLUCOSE 120* 89  < > 85 101* 97 87 88 72  BUN 24* 21*  < > 12 17 16 15 16 13   CREATININE 0.64 0.80  < > 0.73 0.96 0.92 0.81 0.91 0.76  CALCIUM 9.3 9.1  < > 9.0 9.1 8.9 8.5* 8.9 8.7*  MG 1.8 1.9  --  2.0  --   --   --   --   --   PHOS 3.4 3.2  --  2.8  --   --   --   --   --   < > = values in this interval not displayed.   CBC   Recent Labs Lab 08/25/15 0306 08/26/15 0420 08/26/15 0425  HGB 15.4 16.2 16.2  HCT 48.6 52.9* 53.1*  WBC 10.1 9.0 9.1  PLT 147* 148* 160   Anti-Coagulation No results for input(s): INR in the last 168 hours.    Discharge Instructions    Diet - low sodium heart healthy    Complete by:  As directed      Discharge instructions    Complete by:  As directed   Make an appointment with your PRIMARY DOCTOR to review your current blood pressure medicine regimen in the next week please     For home use only DME oxygen    Complete by:  As directed   Mode or (Route):  Nasal cannula  Liters per Minute:  2  Frequency:  Continuous (stationary and portable oxygen unit needed)  Oxygen conserving device:  No  Oxygen delivery system:  Gas     Increase activity slowly    Complete by:  As directed               Follow-up Information    Follow up with BEAUFORD,WAYNE, MD In 1 week.   Specialty:  Pulmonary Disease   Why:  1-2 weeks    Contact information:   [facility] [place] [address] [phone]  Medication List    STOP taking these medications        metoprolol tartrate 25 MG tablet  Commonly known as:  LOPRESSOR      TAKE these medications        amLODipine 10 MG tablet  Commonly known as:  NORVASC  Take 1 tablet by mouth daily.     aspirin 81 MG tablet  Take 81 mg by mouth daily.     cloNIDine 0.1 MG tablet  Commonly known as:  CATAPRES  Take 1 tablet (0.1 mg total) by mouth 3 (three) times daily.     fenofibrate 145 MG tablet  Commonly known as:  TRICOR  Take 1 tablet by mouth daily.     hydrALAZINE 10 MG tablet  Commonly known as:  APRESOLINE  Take 1 tablet (10 mg total) by mouth 3 (three) times daily.     nicotine 7 mg/24hr patch  Commonly known as:  NICODERM CQ - dosed in mg/24 hr  Place 1 patch (7 mg total) onto the skin daily.     predniSONE 10 MG tablet  Commonly known as:  DELTASONE  Take 4 tabs  daily with food x 4 days, then 3 tabs daily x 4 days, then 2 tabs daily x 4 days, then 1 tab daily x4 days then stop. #40     SYMBICORT 160-4.5 MCG/ACT inhaler  Generic drug:  budesonide-formoterol  Inhale 2 puffs into the lungs daily.     tiotropium 18 MCG inhalation capsule  Commonly known as:  SPIRIVA  Place 1 capsule (18 mcg total) into inhaler and inhale daily.          Disposition: Home   Discharged Condition: Sean Arellano has met maximum benefit of inpatient care and is medically stable and cleared for discharge.  Patient is pending follow up as above.      Time spent on disposition:  Greater than 35 minutes.   Signed: Nickolas Madrid, NP 08/29/2015  12:14 PM Pager: [phone] or [phone]

## 2015-08-28 NOTE — Progress Notes (Signed)
Awaiting CT and EEG. Will continue to follow with you.   Felicie MornDavid Smith PA-C [facility] [phone]  08/28/2015, 9:02 AM

## 2015-08-28 NOTE — Progress Notes (Signed)
Physical Therapy Treatment Patient Details Name: Sean ConchJames Gayton MRN: [id number] DOB: 1929/09/04 Today's Date: 08/28/2015    History of Present Illness Pt adm with hypercarbic respiratory failure and intubated 7/6 and extubated 7/8. Pt also with acute encephalopathy. Pt with [facility]. PMH - COPD, HTN    PT Comments    Patient with improved cognition and mobility over last session.  Feel he is safe for d/c home with family support and follow up HHPT.  Noted continued elevation in VS with HR max 126, SpO2 86% ambulating on 2L O2 (back to 92% with seated rest x 1.5 minutes).  Follow Up Recommendations  Home health PT     Equipment Recommendations  Rolling walker with 5" wheels (unsure if he already has one)    Recommendations for Other Services       Precautions / Restrictions Precautions Precautions: Fall    Mobility  Bed Mobility               General bed mobility comments: in chair  Transfers Overall transfer level: Needs assistance Equipment used: Rolling walker (2 wheeled) Transfers: Sit to/from Stand Sit to Stand: Min guard         General transfer comment: safety for lines  Ambulation/Gait Ambulation/Gait assistance: Min guard;Supervision Ambulation Distance (Feet): 170 Feet Assistive device: Rolling walker (2 wheeled) Gait Pattern/deviations: Step-through pattern;Decreased stride length;Wide base of support     General Gait Details: on O2 with ambulation SpO2 dropped to 86%, HR max 126   Stairs            Wheelchair Mobility    Modified Rankin (Stroke Patients Only)       Balance Overall balance assessment: Needs assistance   Sitting balance-Leahy Scale: Good     Standing balance support: Bilateral upper extremity supported Standing balance-Leahy Scale: Poor Standing balance comment: UE support for balance                    Cognition Arousal/Alertness: Awake/alert Behavior During  Therapy: WFL for tasks assessed/performed Overall Cognitive Status: Within Functional Limits for tasks assessed                      Exercises General Exercises - Lower Extremity Ankle Circles/Pumps: AROM;10 reps;Seated;Both Hip Flexion/Marching: AROM;Both;10 reps;Seated    General Comments        Pertinent Vitals/Pain Pain Assessment: No/denies pain    Home Living                      Prior Function            PT Goals (current goals can now be found in the care plan section) Progress towards PT goals: Progressing toward goals    Frequency  Min 3X/week    PT Plan Discharge plan needs to be updated    Co-evaluation             End of Session Equipment Utilized During Treatment: Gait belt;Oxygen Activity Tolerance: Treatment limited secondary to medical complications (Comment) Patient left: in chair;with call bell/phone within reach     Time: 1456-1515 PT Time Calculation (min) (ACUTE ONLY): 19 min  Charges:  $Gait Training: 8-22 mins                    G Codes:      Elray McgregorCynthia Santiago Graf 08/28/2015, 4:08 PM  Sheran Lawlessyndi Neria Procter, PT [phone] 08/28/2015

## 2015-08-28 NOTE — Progress Notes (Signed)
EEG Completed; Results Pending  

## 2015-08-28 NOTE — Progress Notes (Signed)
PULMONARY / CRITICAL CARE MEDICINE   Name: Sean Arellano MRN: [id number] DOB: 12/22/1929    ADMISSION DATE:  08/20/2015 CONSULTATION DATE:  08/20/15  REFERRING MD:  Dr. Patria Mane EDP  CHIEF COMPLAINT:  AMS  BRIEF 82 year old male with acute on chronic hypercarbic respiratory failure.   MICROBIOLOGY: MRSA PCR 7/5:  Negative Blood Ctx x2 7/6>> Urine Strep Ag 7/7:  Negative Urine Legionella Ag 7/7>> Respiratory Panel PCR 7/7:  Negative  Tracheal Asp Ctx 7/7>>  ANTIBIOTICS: Levaquin 7/6>>  LINES/TUBES: OETT 7.5 7/6 - 7/8 OGT 7/6 - 7/8 FOLEY 7/6>> PIV x1  SIGNIFICANT EVENTS: 7/5 - Admitted to ICU on BiPAP 7/6 - Intubated for respiratory insufficiency 7/8 - Extubated  7/9 -  No acute events overnight. Extubated yesterday. Patient reports nausea has resolved. Reports dyspnea improving. Denies any cough. Denies any abdominal pain or pressure. Patient denies any chest pain or pressure. 7/10 - triad assumed primary. Overnight sundowning delirium +. In morning rounds - confusion noticed again and felt to be multifactorial 7/11 brought back to ICU for acute on chronic hypercarbic respiratory failure causing encephalopathy> treated with BIPAP and recovered completely    SUBJECTIVE:  Feels much better Used bipap overnight but not his home machine, says he slept well   VITAL SIGNS: BP 112/45 mmHg  Pulse 94  Temp(Src) 98.2 F (36.8 C) (Oral)  Resp 18  Ht 6' (1.829 m)  Wt 105.235 kg (232 lb)  BMI 31.46 kg/m2  SpO2 95%  INTAKE / OUTPUT: I/O last 3 completed shifts: In: 240 [P.O.:240] Out: 1175 [Urine:1175]  PHYSICAL EXAMINATION: General: resting comfortably in bed HENT: NCAT OP clear PULM: improved air movement today, no wheezing CV: RRR, no mgr GI: BS+, soft, nontender MSK: Normal bulk and tone Neuro: Awake and alert, no distress  LABS:  PULMONARY  Recent Labs Lab 08/21/15 1455 08/22/15 0304 08/24/15 2045 08/26/15 08/26/15 0401  PHART 7.547* 7.507* 7.337*  7.200* 7.309*  PCO2ART 39.4 41.3 58.1* 87.0* 64.5*  PO2ART 60.7* 74.0* 69.0* 107* 69.0*  HCO3 34.4* 32.5* 31.1* 32.7* 32.6*  TCO2 35.7 33.7 33 35.4 35  O2SAT 95.1 96.0 92.0 97.0 91.0    CBC  Recent Labs Lab 08/25/15 0306 08/26/15 0420 08/26/15 0425  HGB 15.4 16.2 16.2  HCT 48.6 52.9* 53.1*  WBC 10.1 9.0 9.1  PLT 147* 148* 160    COAGULATION No results for input(s): INR in the last 168 hours.  CARDIAC    Recent Labs Lab 08/21/15 1252 08/26/15 0554 08/26/15 1117 08/26/15 1645  TROPONINI 0.03* 0.06* 0.06* 0.05*   No results for input(s): PROBNP in the last 168 hours.   CHEMISTRY  Recent Labs Lab 08/22/15 0248 08/22/15 1002 08/23/15 0548 08/24/15 0224  08/25/15 0306 08/26/15 0420 08/26/15 0425 08/27/15 0327 08/28/15 0219  NA 141 143 143 142  < > 144 144 143 142 141  K 3.2* 3.2* 3.5 3.3*  < > 3.2* 4.4 4.5 3.9 3.7  CL 102 104 110 107  < > 108 104 105 102 101  CO2 < > 31 31 32 33* 34*  GLUCOSE 175* 172* 120* 89  < > 85 101* 97 87 88  BUN 25* 27* 24* 21*  < > CREATININE 1.11 0.93 0.64 0.80  < > 0.73 0.96 0.92 0.81 0.91  CALCIUM 9.5 9.2 9.3 9.1  < > 9.0 9.1 8.9 8.5* 8.9  MG 1.9  --  1.8 1.9  --  2.0  --   --   --   --  PHOS <1.0* 2.9 3.4 3.2  --  2.8  --   --   --   --   < > = values in this interval not displayed.   LIVER  Recent Labs Lab 08/23/15 0548 08/24/15 0224 08/25/15 0306 08/26/15 0420  AST  --   --   --  31  ALT  --   --   --  63  ALKPHOS  --   --   --  40  BILITOT  --   --   --  1.1  PROT  --   --   --  6.4*  ALBUMIN 3.0* 3.1* 3.1* 3.3*     INFECTIOUS  Recent Labs Lab 08/23/15 0548 08/24/15 0224 08/26/15 0420 08/26/15 0431  LATICACIDVEN  --   --   --  1.1  PROCALCITON <0.10 <0.10 <0.10  --      ENDOCRINE CBG (last 3)   Recent Labs  08/27/15 1639 08/27/15 2217 08/28/15 0747  GLUCAP 116* 110* 90      No results found.     ASSESSMENT / PLAN:  Acute on chronic respiratory  failure with hypercarbia AE COPD Baseline BIPAP non-compliance Tobacco abuse Acute bronchitis  P:  Will ask family again to bring in home bipap -- tolerated hospital machine overnight  Continue brovana and pulmicort Change to prednisone taper Monitor off abx    Hypertension Elevated Troponin I - Likely demand ischemia w/ hypoxia. Ef 65% on echo 59/17 P:  Daily fibrate Daily norvasc + catapres Tele    Leukocytosis- Likely secondary to steroids. Stable Polycythemia - Likely due to chronic hypoxia Thrombocytopenia - Mild. Stable  P:  Monitor for bleeding    Hyperglycemia - Likely secondary to steroids. Controlled. Hgb A1c 5.6 P:  D/c SSI   Acute Encephalopathy -  Likely multifactorial from hypercarbia, hypoxia, & medication related P:  No sedating meds Neuro following -- awaiting EEG, CT for completeness    FAMILY UPDATE:   No family bedside 7/13 -- tried to call to update and request home bipap - no answer, left message  Will tx floor, mobilize, continue attempts at retrieving home bipap.   Likely d/c in am   Dirk DressKaty Whiteheart, NP 08/28/2015  9:40 AM Pager: [phone] or [phone]  Attending note: Denies chest pain.  Breathing better.  Alert.  Normal strength.  No wheeze.  HR regular.  Abd soft.  Na 141, Creatinine 0.91.  Assessment/plan:  A on C resp failure 2nd to AECOPD. - change to breo, spiriva prn albuterol - wean off prednisone as able - BiPAP qhs  Acute encephalopathy >> resolved; likely related to hypoxia, hypercapnia. - defer to neuro whether EEG, CT head still needed  Likely will be ready for d/c home 7/14.  Coralyn HellingVineet Luisa Louk, MD [facility] 08/28/2015, 11:03 AM Pager:  [phone] After 3pm call: [phone]

## 2015-08-28 NOTE — Care Management Important Message (Signed)
Important Message  Patient Details  Name: Sean Arellano MRN: [id number] Date of Birth: 1929/12/17   Medicare Important Message Given:  Yes    Keveon Amsler Abena 08/28/2015, 12:12 PM

## 2015-08-29 LAB — BASIC METABOLIC PANEL
Anion gap: 8 (ref 5–15)
BUN: 13 mg/dL (ref 6–20)
CALCIUM: 8.7 mg/dL — AB (ref 8.9–10.3)
CHLORIDE: 101 mmol/L (ref 101–111)
CO2: 31 mmol/L (ref 22–32)
CREATININE: 0.76 mg/dL (ref 0.61–1.24)
GFR calc non Af Amer: >60 mL/min (ref >60)
Glucose, Bld: 72 mg/dL (ref 65–99)
Potassium: 3.8 mmol/L (ref 3.5–5.1)
SODIUM: 140 mmol/L (ref 135–145)

## 2015-08-29 MED ORDER — NICOTINE 7 MG/24HR TD PT24: 7.0000 mg | MEDICATED_PATCH | TRANSDERMAL | Status: AC

## 2015-08-29 MED ORDER — TIOTROPIUM BROMIDE MONOHYDRATE 18 MCG IN CAPS: 18.0000 ug | ORAL_CAPSULE | Freq: Every day | RESPIRATORY_TRACT | Status: AC

## 2015-08-29 MED ORDER — PREDNISONE 10 MG PO TABS: ORAL_TABLET | ORAL | Status: AC

## 2015-08-29 MED ORDER — CLONIDINE HCL 0.1 MG PO TABS: 0.1000 mg | ORAL_TABLET | Freq: Three times a day (TID) | ORAL | Status: AC

## 2015-08-29 MED ORDER — HYDRALAZINE HCL 10 MG PO TABS: 10.0000 mg | ORAL_TABLET | Freq: Three times a day (TID) | ORAL | Status: AC

## 2015-08-29 NOTE — Progress Notes (Signed)
Patient arrived to 5C19 from [facility]. Safety precautions and orders reviewed with patient. O2 at 2L applied. Pt ambulated in chair with minimal assist. Patient denied any distress. Will continue to monitor.   Sim BoastHavy, RN

## 2015-08-29 NOTE — Progress Notes (Signed)
RT notified by RN that patient's family had brought in home CPAP unit to trial.  Patient had been placed on V60 before RT was made aware.    Water chamber noted to have standing water upon inspection.  Per notes patient had not been utilizing this device for quite some time.  Upon closer inspection RT noted pink, sludge-link residue present on lid and heater plate of water chamber.  RT did not apply home CPAP due to this discovery.  RN was made aware of same.  Would recommend patient follow up with DME provider prior to utilizing machine.

## 2015-08-29 NOTE — Care Management Note (Signed)
Case Management Note  Patient Details  Name: Sean Arellano MRN: [id number] Date of Birth: 1929/05/25  Subjective/Objective:                    Action/Plan: [facility]. Bedside RN aware.   Expected Discharge Date:                  Expected Discharge Plan:  Home w Home Health Services  In-House Referral:     Discharge planning Services  CM Consult  Post Acute Care Choice:  Durable Medical Equipment, Home Health Choice offered to:  Patient  DME Arranged:  Walker rolling DME Agency:  [facility]  HH Arranged:  [facility]  [facility]  Status of Service:  Completed, signed off  If discussed at Long Length of Stay Meetings, dates discussed:    Additional Comments:  Kermit BaloKelli F Alexsis Branscom, RN 08/29/2015, 2:17 PM

## 2015-08-29 NOTE — Progress Notes (Addendum)
Report given to RN on [facility]. Patient will be transferred via bed to 5C19. Belongings sent with patient included home BiPAP machine. Patient's son notified of transfer. Also RN alerted to situation with patient's home BiPAP and the pink sludge. Per [facility]- patient will need to have this addressed outpatient at the store he got it from.   Noe GensStefanie A Jhoel Stieg, RN

## 2015-08-29 NOTE — Progress Notes (Signed)
Discharge instructions reviewed with patient/family. All questions answered at this time. Transport home by family.   Mattalynn Crandle, RN 

## 2015-08-29 NOTE — Progress Notes (Signed)
Physical Therapy Treatment Patient Details Name: Sean ConchJames Tennison MRN: [id number] DOB: 1929/06/27 Today's Date: 08/29/2015    History of Present Illness Pt adm with hypercarbic respiratory failure and intubated 7/6 and extubated 7/8. Pt also with acute encephalopathy. Pt with [facility]. PMH - COPD, HTN    PT Comments    Patient progressing with balance and gait, but still will be safest at home with walker rather than cane due to veers to R and unable to avoid obstacles.  Also continued concern for cognition as confabulating [place].  PT to follow until d/c and continue to recommend HHPT at d/c.   Follow Up Recommendations  Home health PT     Equipment Recommendations  Rolling walker with 5" wheels    Recommendations for Other Services       Precautions / Restrictions Precautions Precautions: Fall Precaution Comments: O2 dependent    Mobility  Bed Mobility               General bed mobility comments: in chair  Transfers Overall transfer level: Needs assistance Equipment used: Straight cane Transfers: Sit to/from Stand Sit to Stand: Supervision;Min guard         General transfer comment: assist for safety  Ambulation/Gait Ambulation/Gait assistance: Min guard Ambulation Distance (Feet): 220 Feet (x 2) Assistive device: Straight cane Gait Pattern/deviations: Step-through pattern;Trunk flexed;Drifts right/left     General Gait Details: on O2 for ambulation, HR 105; patient having difficutly maneuvering around wall desk in hallway despite cues to avoid, assist for safety   Stairs Stairs: Yes Stairs assistance: Min guard Stair Management: Alternating pattern;Step to pattern;Forwards;One rail Left;With cane Number of Stairs: 3 General stair comments: self selected sequence and minguard for safety  Wheelchair Mobility    Modified Rankin (Stroke Patients Only)        Balance Overall balance assessment: Needs assistance         Standing balance support: Single extremity supported;No upper extremity supported Standing balance-Leahy Scale: Fair Standing balance comment: initially static balance without or with cane supervision, but patient with sinking postural support with fatigue with cane min A and cues for posture                    Cognition Arousal/Alertness: Awake/alert Behavior During Therapy: WFL for tasks assessed/performed   Area of Impairment: Memory     Memory: Decreased short-term memory   Safety/Judgement: Decreased awareness of deficits;Decreased awareness of safety     General Comments: confabulatory about abduction out of the country a [place] ago    Exercises      General Comments General comments (skin integrity, edema, etc.): [facility] here and helping to deal with c-pap needing serviceing, grandson in room and family report pt will have assist from family and hired help for assist at d/c.      Pertinent Vitals/Pain Pain Assessment: No/denies pain    Home Living                      Prior Function            PT Goals (current goals can now be found in the care plan section) Progress towards PT goals: Progressing toward goals    Frequency  Min 3X/week    PT Plan Current plan remains appropriate    Co-evaluation  End of Session Equipment Utilized During Treatment: Gait belt;Oxygen Activity Tolerance: Patient limited by fatigue Patient left: in chair;with call bell/phone within reach     Time: 1125-1150 PT Time Calculation (min) (ACUTE ONLY): 25 min  Charges:  $Gait Training: 23-37 mins                    G Codes:      Elray Mcgregor 09/18/15, 12:53 PM Sheran Lawless, PT [phone] 09-18-15

## 2015-08-29 NOTE — Progress Notes (Signed)
Spoke w pt. He plans to go home w 2 grandsons. He's doing better w phy there. Have ordered pt rolling walker. Pt has cpap but it is old and may need replaced. Spoke w Vaughan Bastajermaine w [facility] and pt will need to call or take by store cannot be done in hospital. He has o2 w [facility]. He has bsc and walking sticks. He has aid 2hrs per day 4x/wk. He has used [facility] for hhc and have made tent ref to donna w [facility] for hhrn and hhpt. Poss dc home today.

## 2015-09-04 ENCOUNTER — Inpatient Hospital Stay: Payer: Medicare Other | Admitting: Acute Care

## 2016-08-31 ENCOUNTER — Other Ambulatory Visit (HOSPITAL_COMMUNITY): Payer: Medicare Other

## 2016-08-31 ENCOUNTER — Inpatient Hospital Stay
Admission: RE | Admit: 2016-08-31 | Discharge: 2016-10-21 | Disposition: A | Discharge status: Skilled Nursing Facility | Payer: Medicare Other | Source: Other Acute Inpatient Hospital | Attending: Internal Medicine | Admitting: Internal Medicine

## 2016-08-31 DIAGNOSIS — T80219A Unspecified infection due to central venous catheter, initial encounter: Secondary | ICD-10-CM

## 2016-08-31 DIAGNOSIS — Z9889 Other specified postprocedural states: Secondary | ICD-10-CM

## 2016-08-31 DIAGNOSIS — N179 Acute kidney failure, unspecified: Secondary | ICD-10-CM

## 2016-08-31 DIAGNOSIS — R188 Other ascites: Secondary | ICD-10-CM

## 2016-08-31 DIAGNOSIS — J9600 Acute respiratory failure, unspecified whether with hypoxia or hypercapnia: Secondary | ICD-10-CM

## 2016-08-31 DIAGNOSIS — Z4659 Encounter for fitting and adjustment of other gastrointestinal appliance and device: Secondary | ICD-10-CM

## 2016-08-31 DIAGNOSIS — J90 Pleural effusion, not elsewhere classified: Secondary | ICD-10-CM

## 2016-08-31 DIAGNOSIS — Z01818 Encounter for other preprocedural examination: Secondary | ICD-10-CM

## 2016-08-31 DIAGNOSIS — Z9911 Dependence on respirator [ventilator] status: Secondary | ICD-10-CM

## 2016-08-31 DIAGNOSIS — J189 Pneumonia, unspecified organism: Secondary | ICD-10-CM

## 2016-08-31 DIAGNOSIS — R0603 Acute respiratory distress: Secondary | ICD-10-CM

## 2016-08-31 DIAGNOSIS — J9690 Respiratory failure, unspecified, unspecified whether with hypoxia or hypercapnia: Secondary | ICD-10-CM

## 2016-08-31 DIAGNOSIS — K567 Ileus, unspecified: Secondary | ICD-10-CM

## 2016-08-31 LAB — BLOOD GAS, ARTERIAL
Acid-Base Excess: 18.4 mmol/L — ABNORMAL HIGH (ref 0.0–2.0)
BICARBONATE: 44.2 mmol/L — AB (ref 20.0–28.0)
Delivery systems: POSITIVE
EXPIRATORY PAP: 8
FIO2: 70
INSPIRATORY PAP: 20
O2 SAT: 93.6 %
PATIENT TEMPERATURE: 98.6
PCO2 ART: 69.2 mmHg — AB (ref 32.0–48.0)
PH ART: 7.421 (ref 7.350–7.450)
PO2 ART: 71.5 mmHg — AB (ref 83.0–108.0)
RATE: 22 resp/min

## 2016-09-01 LAB — CBC WITH DIFFERENTIAL/PLATELET
Basophils Absolute: 0 10*3/uL (ref 0.0–0.1)
Basophils Relative: 0 %
EOS PCT: 0 %
Eosinophils Absolute: 0 10*3/uL (ref 0.0–0.7)
HEMATOCRIT: 30.7 % — AB (ref 39.0–52.0)
Hemoglobin: 9.5 g/dL — ABNORMAL LOW (ref 13.0–17.0)
LYMPHS PCT: 4 %
Lymphs Abs: 0.6 10*3/uL — ABNORMAL LOW (ref 0.7–4.0)
MCH: 29.5 pg (ref 26.0–34.0)
MCHC: 30.9 g/dL (ref 30.0–36.0)
MCV: 95.3 fL (ref 78.0–100.0)
MONO ABS: 1.2 10*3/uL — AB (ref 0.1–1.0)
MONOS PCT: 8 %
NEUTROS ABS: 14.1 10*3/uL — AB (ref 1.7–7.7)
Neutrophils Relative %: 88 %
PLATELETS: 186 10*3/uL (ref 150–400)
RBC: 3.22 MIL/uL — ABNORMAL LOW (ref 4.22–5.81)
RDW: 15 % (ref 11.5–15.5)
WBC: 16.3 10*3/uL — ABNORMAL HIGH (ref 4.0–10.5)

## 2016-09-01 LAB — BLOOD GAS, ARTERIAL
Acid-Base Excess: 16.2 mmol/L — ABNORMAL HIGH (ref 0.0–2.0)
BICARBONATE: 42.3 mmol/L — AB (ref 20.0–28.0)
Delivery systems: POSITIVE
EXPIRATORY PAP: 10
FIO2: 100
INSPIRATORY PAP: 20
O2 SAT: 87.9 %
PO2 ART: 60 mmHg — AB (ref 83.0–108.0)
Patient temperature: 98.6
pCO2 arterial: 74.4 mmHg (ref 32.0–48.0)
pH, Arterial: 7.373 (ref 7.350–7.450)

## 2016-09-01 LAB — COMPREHENSIVE METABOLIC PANEL
ALBUMIN: 3.3 g/dL — AB (ref 3.5–5.0)
ALT: 30 U/L (ref 17–63)
ANION GAP: 9 (ref 5–15)
AST: 19 U/L (ref 15–41)
Alkaline Phosphatase: 38 U/L (ref 38–126)
BILIRUBIN TOTAL: 1.8 mg/dL — AB (ref 0.3–1.2)
BUN: 20 mg/dL (ref 6–20)
CHLORIDE: 94 mmol/L — AB (ref 101–111)
CO2: 39 mmol/L — AB (ref 22–32)
Calcium: 9 mg/dL (ref 8.9–10.3)
Creatinine, Ser: 0.87 mg/dL (ref 0.61–1.24)
GFR calc Af Amer: >60 mL/min (ref >60)
GFR calc non Af Amer: >60 mL/min (ref >60)
GLUCOSE: 116 mg/dL — AB (ref 65–99)
POTASSIUM: 4.1 mmol/L (ref 3.5–5.1)
SODIUM: 142 mmol/L (ref 135–145)
TOTAL PROTEIN: 5.5 g/dL — AB (ref 6.5–8.1)

## 2016-09-01 LAB — MAGNESIUM: Magnesium: 2.2 mg/dL (ref 1.7–2.4)

## 2016-09-04 ENCOUNTER — Other Ambulatory Visit (HOSPITAL_COMMUNITY): Payer: Medicare Other

## 2016-09-04 LAB — BASIC METABOLIC PANEL
Anion gap: 10 (ref 5–15)
BUN: 21 mg/dL — AB (ref 6–20)
CALCIUM: 9 mg/dL (ref 8.9–10.3)
CHLORIDE: 98 mmol/L — AB (ref 101–111)
CO2: 37 mmol/L — ABNORMAL HIGH (ref 22–32)
CREATININE: 0.72 mg/dL (ref 0.61–1.24)
Glucose, Bld: 147 mg/dL — ABNORMAL HIGH (ref 65–99)
Potassium: 4.5 mmol/L (ref 3.5–5.1)
SODIUM: 145 mmol/L (ref 135–145)

## 2016-09-04 LAB — BLOOD GAS, ARTERIAL
ACID-BASE EXCESS: 17.1 mmol/L — AB (ref 0.0–2.0)
Acid-Base Excess: 17.8 mmol/L — ABNORMAL HIGH (ref 0.0–2.0)
BICARBONATE: 44.1 mmol/L — AB (ref 20.0–28.0)
Bicarbonate: 44.2 mmol/L — ABNORMAL HIGH (ref 20.0–28.0)
Delivery systems: POSITIVE
EXPIRATORY PAP: 10
FIO2: 90
FIO2: 100
INSPIRATORY PAP: 20
O2 Saturation: 90.6 %
O2 Saturation: 87.9 %
PATIENT TEMPERATURE: 98.6
PCO2 ART: 91.6 mmHg — AB (ref 32.0–48.0)
PH ART: 7.304 — AB (ref 7.350–7.450)
PO2 ART: 60.8 mmHg — AB (ref 83.0–108.0)
Patient temperature: 98.6
pCO2 arterial: 81.4 mmHg (ref 32.0–48.0)
pH, Arterial: 7.354 (ref 7.350–7.450)
pO2, Arterial: 70.9 mmHg — ABNORMAL LOW (ref 83.0–108.0)

## 2016-09-04 LAB — CBC
HCT: 39.3 % (ref 39.0–52.0)
HEMOGLOBIN: 11.8 g/dL — AB (ref 13.0–17.0)
MCH: 29.4 pg (ref 26.0–34.0)
MCHC: 30 g/dL (ref 30.0–36.0)
MCV: 98 fL (ref 78.0–100.0)
Platelets: 116 10*3/uL — ABNORMAL LOW (ref 150–400)
RBC: 4.01 MIL/uL — ABNORMAL LOW (ref 4.22–5.81)
RDW: 14.9 % (ref 11.5–15.5)
WBC: 14.7 10*3/uL — ABNORMAL HIGH (ref 4.0–10.5)

## 2016-09-05 ENCOUNTER — Other Ambulatory Visit (HOSPITAL_COMMUNITY): Payer: Medicare Other

## 2016-09-05 LAB — VANCOMYCIN, TROUGH: Vancomycin Tr: 20 ug/mL (ref 15–20)

## 2016-09-05 LAB — BLOOD GAS, ARTERIAL
Acid-Base Excess: 18.9 mmol/L — ABNORMAL HIGH (ref 0.0–2.0)
BICARBONATE: 44.7 mmol/L — AB (ref 20.0–28.0)
Delivery systems: POSITIVE
EXPIRATORY PAP: 10
FIO2: 100
Inspiratory PAP: 20
O2 Saturation: 97.5 %
PATIENT TEMPERATURE: 98.6
PO2 ART: 100 mmHg (ref 83.0–108.0)
RATE: 10 resp/min
pCO2 arterial: 69.8 mmHg (ref 32.0–48.0)
pH, Arterial: 7.423 (ref 7.350–7.450)

## 2016-09-06 LAB — BASIC METABOLIC PANEL
Anion gap: 9 (ref 5–15)
BUN: 21 mg/dL — AB (ref 6–20)
CHLORIDE: 102 mmol/L (ref 101–111)
CO2: 39 mmol/L — ABNORMAL HIGH (ref 22–32)
CREATININE: 0.95 mg/dL (ref 0.61–1.24)
Calcium: 8.9 mg/dL (ref 8.9–10.3)
Glucose, Bld: 144 mg/dL — ABNORMAL HIGH (ref 65–99)
Potassium: 3.5 mmol/L (ref 3.5–5.1)
Sodium: 150 mmol/L — ABNORMAL HIGH (ref 135–145)

## 2016-09-06 LAB — BLOOD GAS, ARTERIAL
ACID-BASE EXCESS: 19.3 mmol/L — AB (ref 0.0–2.0)
ACID-BASE EXCESS: 16.9 mmol/L — AB (ref 0.0–2.0)
BICARBONATE: 44.9 mmol/L — AB (ref 20.0–28.0)
Bicarbonate: 41.2 mmol/L — ABNORMAL HIGH (ref 20.0–28.0)
DRAWN BY: 414221
Delivery systems: POSITIVE
Delivery systems: POSITIVE
EXPIRATORY PAP: 10
Expiratory PAP: 10
FIO2: 100
FIO2: 100
Inspiratory PAP: 20
Inspiratory PAP: 20
Mode: POSITIVE
O2 SAT: 92.4 %
O2 SAT: 97.2 %
PATIENT TEMPERATURE: 98.6
PATIENT TEMPERATURE: 98.3
PCO2 ART: 67.4 mmHg — AB (ref 32.0–48.0)
PCO2 ART: 48 mmHg (ref 32.0–48.0)
PH ART: 7.439 (ref 7.350–7.450)
PO2 ART: 65.4 mmHg — AB (ref 83.0–108.0)
RATE: 10 resp/min
pH, Arterial: 7.542 — ABNORMAL HIGH (ref 7.350–7.450)
pO2, Arterial: 141 mmHg — ABNORMAL HIGH (ref 83.0–108.0)

## 2016-09-06 LAB — CBC WITH DIFFERENTIAL/PLATELET
BASOS PCT: 0 %
Basophils Absolute: 0 10*3/uL (ref 0.0–0.1)
EOS ABS: 0 10*3/uL (ref 0.0–0.7)
EOS PCT: 0 %
HCT: 38.1 % — ABNORMAL LOW (ref 39.0–52.0)
HEMOGLOBIN: 11.4 g/dL — AB (ref 13.0–17.0)
LYMPHS ABS: 0.3 10*3/uL — AB (ref 0.7–4.0)
Lymphocytes Relative: 2 %
MCH: 28.9 pg (ref 26.0–34.0)
MCHC: 29.9 g/dL — AB (ref 30.0–36.0)
MCV: 96.5 fL (ref 78.0–100.0)
MONOS PCT: 3 %
Monocytes Absolute: 0.4 10*3/uL (ref 0.1–1.0)
NEUTROS PCT: 95 %
Neutro Abs: 14.4 10*3/uL — ABNORMAL HIGH (ref 1.7–7.7)
PLATELETS: 142 10*3/uL — AB (ref 150–400)
RBC: 3.95 MIL/uL — AB (ref 4.22–5.81)
RDW: 15.1 % (ref 11.5–15.5)
WBC: 15.2 10*3/uL — AB (ref 4.0–10.5)

## 2016-09-07 ENCOUNTER — Other Ambulatory Visit (HOSPITAL_COMMUNITY): Payer: Medicare Other

## 2016-09-07 LAB — MAGNESIUM: MAGNESIUM: 2.3 mg/dL (ref 1.7–2.4)

## 2016-09-07 LAB — URINALYSIS, ROUTINE W REFLEX MICROSCOPIC
BILIRUBIN URINE: NEGATIVE
Bacteria, UA: NONE SEEN
GLUCOSE, UA: NEGATIVE mg/dL
HGB URINE DIPSTICK: NEGATIVE
KETONES UR: NEGATIVE mg/dL
LEUKOCYTES UA: NEGATIVE
Nitrite: NEGATIVE
PROTEIN: 30 mg/dL — AB
Specific Gravity, Urine: 1.023 (ref 1.005–1.030)
pH: 7 (ref 5.0–8.0)

## 2016-09-07 LAB — BASIC METABOLIC PANEL
ANION GAP: 10 (ref 5–15)
BUN: 22 mg/dL — ABNORMAL HIGH (ref 6–20)
CALCIUM: 9.3 mg/dL (ref 8.9–10.3)
CO2: 40 mmol/L — AB (ref 22–32)
CREATININE: 0.84 mg/dL (ref 0.61–1.24)
Chloride: 102 mmol/L (ref 101–111)
Glucose, Bld: 115 mg/dL — ABNORMAL HIGH (ref 65–99)
Potassium: 3.5 mmol/L (ref 3.5–5.1)
SODIUM: 152 mmol/L — AB (ref 135–145)

## 2016-09-07 LAB — CBC
HCT: 40.2 % (ref 39.0–52.0)
Hemoglobin: 12 g/dL — ABNORMAL LOW (ref 13.0–17.0)
MCH: 28.6 pg (ref 26.0–34.0)
MCHC: 29.9 g/dL — AB (ref 30.0–36.0)
MCV: 95.9 fL (ref 78.0–100.0)
PLATELETS: 135 10*3/uL — AB (ref 150–400)
RBC: 4.19 MIL/uL — ABNORMAL LOW (ref 4.22–5.81)
RDW: 15.4 % (ref 11.5–15.5)
WBC: 19.4 10*3/uL — AB (ref 4.0–10.5)

## 2016-09-07 LAB — BLOOD GAS, ARTERIAL
ACID-BASE EXCESS: 16.6 mmol/L — AB (ref 0.0–2.0)
Acid-Base Excess: 17.3 mmol/L — ABNORMAL HIGH (ref 0.0–2.0)
Bicarbonate: 42.6 mmol/L — ABNORMAL HIGH (ref 20.0–28.0)
Bicarbonate: 41.7 mmol/L — ABNORMAL HIGH (ref 20.0–28.0)
DELIVERY SYSTEMS: POSITIVE
EXPIRATORY PAP: 12
FIO2: 100
FIO2: 90
INSPIRATORY PAP: 20
O2 SAT: 89.7 %
O2 Saturation: 94.1 %
PATIENT TEMPERATURE: 97
PEEP/CPAP: 10 cmH2O
PH ART: 7.462 — AB (ref 7.350–7.450)
PO2 ART: 70.4 mmHg — AB (ref 83.0–108.0)
Patient temperature: 98.6
RATE: 10 resp/min
RATE: 16 resp/min
VT: 550 mL
pCO2 arterial: 60.1 mmHg — ABNORMAL HIGH (ref 32.0–48.0)
pCO2 arterial: 59.1 mmHg — ABNORMAL HIGH (ref 32.0–48.0)
pH, Arterial: 7.46 — ABNORMAL HIGH (ref 7.350–7.450)
pO2, Arterial: 60.5 mmHg — ABNORMAL LOW (ref 83.0–108.0)

## 2016-09-07 LAB — PHOSPHORUS: PHOSPHORUS: 3 mg/dL (ref 2.5–4.6)

## 2016-09-07 LAB — VANCOMYCIN, RANDOM: Vancomycin Rm: 13

## 2016-09-08 ENCOUNTER — Other Ambulatory Visit (HOSPITAL_COMMUNITY): Payer: Medicare Other

## 2016-09-08 LAB — BASIC METABOLIC PANEL
Anion gap: 7 (ref 5–15)
BUN: 30 mg/dL — AB (ref 6–20)
CALCIUM: 8.3 mg/dL — AB (ref 8.9–10.3)
CHLORIDE: 105 mmol/L (ref 101–111)
CO2: 37 mmol/L — AB (ref 22–32)
CREATININE: 1.48 mg/dL — AB (ref 0.61–1.24)
GFR calc non Af Amer: 41 mL/min — ABNORMAL LOW (ref >60)
GFR, EST AFRICAN AMERICAN: 48 mL/min — AB (ref >60)
GLUCOSE: 215 mg/dL — AB (ref 65–99)
Potassium: 3 mmol/L — ABNORMAL LOW (ref 3.5–5.1)
Sodium: 149 mmol/L — ABNORMAL HIGH (ref 135–145)

## 2016-09-08 LAB — URINE CULTURE: Culture: NO GROWTH

## 2016-09-08 LAB — CK TOTAL AND CKMB (NOT AT ARMC)
CK, MB: 0.9 ng/mL (ref 0.5–5.0)
CK, MB: 1.2 ng/mL (ref 0.5–5.0)
RELATIVE INDEX: INVALID (ref 0.0–2.5)
Relative Index: INVALID (ref 0.0–2.5)
Total CK: 41 U/L — ABNORMAL LOW (ref 49–397)
Total CK: 47 U/L — ABNORMAL LOW (ref 49–397)

## 2016-09-08 LAB — TROPONIN I
TROPONIN I: 0.82 ng/mL — AB (ref <0.03)
Troponin I: 1.01 ng/mL (ref <0.03)

## 2016-09-08 LAB — VANCOMYCIN, TROUGH: Vancomycin Tr: 45 ug/mL (ref 15–20)

## 2016-09-08 LAB — CBC
HEMATOCRIT: 30.7 % — AB (ref 39.0–52.0)
Hemoglobin: 9.4 g/dL — ABNORMAL LOW (ref 13.0–17.0)
MCH: 29.5 pg (ref 26.0–34.0)
MCHC: 30.6 g/dL (ref 30.0–36.0)
MCV: 96.2 fL (ref 78.0–100.0)
Platelets: 100 10*3/uL — ABNORMAL LOW (ref 150–400)
RBC: 3.19 MIL/uL — ABNORMAL LOW (ref 4.22–5.81)
RDW: 16.1 % — AB (ref 11.5–15.5)
WBC: 13.1 10*3/uL — ABNORMAL HIGH (ref 4.0–10.5)

## 2016-09-08 LAB — MAGNESIUM: Magnesium: 2.1 mg/dL (ref 1.7–2.4)

## 2016-09-08 LAB — PROCALCITONIN: Procalcitonin: 0.15 ng/mL

## 2016-09-08 LAB — PHOSPHORUS: Phosphorus: 3.1 mg/dL (ref 2.5–4.6)

## 2016-09-09 ENCOUNTER — Other Ambulatory Visit (HOSPITAL_COMMUNITY): Payer: Medicare Other

## 2016-09-09 LAB — RENAL FUNCTION PANEL
ALBUMIN: 2.4 g/dL — AB (ref 3.5–5.0)
ANION GAP: 10 (ref 5–15)
BUN: 51 mg/dL — ABNORMAL HIGH (ref 6–20)
CALCIUM: 8.1 mg/dL — AB (ref 8.9–10.3)
CO2: 34 mmol/L — ABNORMAL HIGH (ref 22–32)
CREATININE: 3.11 mg/dL — AB (ref 0.61–1.24)
Chloride: 101 mmol/L (ref 101–111)
GFR, EST AFRICAN AMERICAN: 20 mL/min — AB (ref >60)
GFR, EST NON AFRICAN AMERICAN: 17 mL/min — AB (ref >60)
Glucose, Bld: 222 mg/dL — ABNORMAL HIGH (ref 65–99)
PHOSPHORUS: 2.8 mg/dL (ref 2.5–4.6)
Potassium: 3.5 mmol/L (ref 3.5–5.1)
SODIUM: 145 mmol/L (ref 135–145)

## 2016-09-09 LAB — CBC WITH DIFFERENTIAL/PLATELET
Basophils Absolute: 0 10*3/uL (ref 0.0–0.1)
Basophils Relative: 0 %
EOS ABS: 0 10*3/uL (ref 0.0–0.7)
Eosinophils Relative: 0 %
HCT: 30.4 % — ABNORMAL LOW (ref 39.0–52.0)
HEMOGLOBIN: 9.5 g/dL — AB (ref 13.0–17.0)
LYMPHS PCT: 3 %
Lymphs Abs: 0.6 10*3/uL — ABNORMAL LOW (ref 0.7–4.0)
MCH: 29.9 pg (ref 26.0–34.0)
MCHC: 31.3 g/dL (ref 30.0–36.0)
MCV: 95.6 fL (ref 78.0–100.0)
MONO ABS: 0.4 10*3/uL (ref 0.1–1.0)
Monocytes Relative: 2 %
NEUTROS PCT: 95 %
Neutro Abs: 19.3 10*3/uL — ABNORMAL HIGH (ref 1.7–7.7)
PLATELETS: 92 10*3/uL — AB (ref 150–400)
RBC: 3.18 MIL/uL — AB (ref 4.22–5.81)
RDW: 16.4 % — ABNORMAL HIGH (ref 11.5–15.5)
WBC: 20.3 10*3/uL — AB (ref 4.0–10.5)

## 2016-09-09 LAB — C DIFFICILE QUICK SCREEN W PCR REFLEX
C DIFFICILE (CDIFF) INTERP: NOT DETECTED
C DIFFICILE (CDIFF) TOXIN: NEGATIVE
C Diff antigen: NEGATIVE

## 2016-09-09 LAB — CK TOTAL AND CKMB (NOT AT ARMC)
CK, MB: 1.1 ng/mL (ref 0.5–5.0)
CK, MB: 1 ng/mL (ref 0.5–5.0)
Relative Index: INVALID (ref 0.0–2.5)
Relative Index: INVALID (ref 0.0–2.5)
Total CK: 42 U/L — ABNORMAL LOW (ref 49–397)
Total CK: 39 U/L — ABNORMAL LOW (ref 49–397)

## 2016-09-09 LAB — MAGNESIUM: MAGNESIUM: 2.2 mg/dL (ref 1.7–2.4)

## 2016-09-09 LAB — TROPONIN I
Troponin I: 0.94 ng/mL (ref <0.03)
Troponin I: 0.77 ng/mL (ref <0.03)

## 2016-09-10 LAB — RENAL FUNCTION PANEL
ALBUMIN: 2.1 g/dL — AB (ref 3.5–5.0)
Anion gap: 12 (ref 5–15)
BUN: 60 mg/dL — AB (ref 6–20)
CALCIUM: 7.9 mg/dL — AB (ref 8.9–10.3)
CO2: 31 mmol/L (ref 22–32)
CREATININE: 3.82 mg/dL — AB (ref 0.61–1.24)
Chloride: 101 mmol/L (ref 101–111)
GFR calc Af Amer: 15 mL/min — ABNORMAL LOW (ref >60)
GFR, EST NON AFRICAN AMERICAN: 13 mL/min — AB (ref >60)
GLUCOSE: 177 mg/dL — AB (ref 65–99)
PHOSPHORUS: 4.1 mg/dL (ref 2.5–4.6)
Potassium: 3.6 mmol/L (ref 3.5–5.1)
SODIUM: 144 mmol/L (ref 135–145)

## 2016-09-10 LAB — CBC
HEMATOCRIT: 28.5 % — AB (ref 39.0–52.0)
Hemoglobin: 8.8 g/dL — ABNORMAL LOW (ref 13.0–17.0)
MCH: 28.9 pg (ref 26.0–34.0)
MCHC: 30.9 g/dL (ref 30.0–36.0)
MCV: 93.4 fL (ref 78.0–100.0)
Platelets: 93 10*3/uL — ABNORMAL LOW (ref 150–400)
RBC: 3.05 MIL/uL — ABNORMAL LOW (ref 4.22–5.81)
RDW: 16.1 % — AB (ref 11.5–15.5)
WBC: 18 10*3/uL — AB (ref 4.0–10.5)

## 2016-09-10 LAB — CK TOTAL AND CKMB (NOT AT ARMC)
CK, MB: 1.3 ng/mL (ref 0.5–5.0)
Relative Index: INVALID (ref 0.0–2.5)
Total CK: 32 U/L — ABNORMAL LOW (ref 49–397)

## 2016-09-10 LAB — TROPONIN I: Troponin I: 0.71 ng/mL (ref <0.03)

## 2016-09-10 LAB — MAGNESIUM: MAGNESIUM: 2.4 mg/dL (ref 1.7–2.4)

## 2016-09-11 LAB — CBC
HCT: 29.8 % — ABNORMAL LOW (ref 39.0–52.0)
Hemoglobin: 9.3 g/dL — ABNORMAL LOW (ref 13.0–17.0)
MCH: 29.1 pg (ref 26.0–34.0)
MCHC: 31.2 g/dL (ref 30.0–36.0)
MCV: 93.1 fL (ref 78.0–100.0)
Platelets: 98 10*3/uL — ABNORMAL LOW (ref 150–400)
RBC: 3.2 MIL/uL — ABNORMAL LOW (ref 4.22–5.81)
RDW: 16.1 % — AB (ref 11.5–15.5)
WBC: 14.7 10*3/uL — AB (ref 4.0–10.5)

## 2016-09-11 LAB — RENAL FUNCTION PANEL
ALBUMIN: 2.4 g/dL — AB (ref 3.5–5.0)
Anion gap: 11 (ref 5–15)
BUN: 70 mg/dL — AB (ref 6–20)
CO2: 30 mmol/L (ref 22–32)
CREATININE: 4.31 mg/dL — AB (ref 0.61–1.24)
Calcium: 7.9 mg/dL — ABNORMAL LOW (ref 8.9–10.3)
Chloride: 100 mmol/L — ABNORMAL LOW (ref 101–111)
GFR, EST AFRICAN AMERICAN: 13 mL/min — AB (ref >60)
GFR, EST NON AFRICAN AMERICAN: 11 mL/min — AB (ref >60)
Glucose, Bld: 152 mg/dL — ABNORMAL HIGH (ref 65–99)
PHOSPHORUS: 4.8 mg/dL — AB (ref 2.5–4.6)
Potassium: 4 mmol/L (ref 3.5–5.1)
Sodium: 141 mmol/L (ref 135–145)

## 2016-09-11 LAB — CULTURE, RESPIRATORY

## 2016-09-11 LAB — MAGNESIUM: MAGNESIUM: 2.4 mg/dL (ref 1.7–2.4)

## 2016-09-12 ENCOUNTER — Other Ambulatory Visit (HOSPITAL_COMMUNITY): Payer: Medicare Other

## 2016-09-12 LAB — MAGNESIUM: MAGNESIUM: 2.6 mg/dL — AB (ref 1.7–2.4)

## 2016-09-12 LAB — RENAL FUNCTION PANEL
ALBUMIN: 2.1 g/dL — AB (ref 3.5–5.0)
Anion gap: 12 (ref 5–15)
BUN: 79 mg/dL — AB (ref 6–20)
CALCIUM: 8.1 mg/dL — AB (ref 8.9–10.3)
CO2: 32 mmol/L (ref 22–32)
CREATININE: 4.44 mg/dL — AB (ref 0.61–1.24)
Chloride: 98 mmol/L — ABNORMAL LOW (ref 101–111)
GFR calc Af Amer: 13 mL/min — ABNORMAL LOW (ref >60)
GFR calc non Af Amer: 11 mL/min — ABNORMAL LOW (ref >60)
GLUCOSE: 121 mg/dL — AB (ref 65–99)
PHOSPHORUS: 4.3 mg/dL (ref 2.5–4.6)
POTASSIUM: 3 mmol/L — AB (ref 3.5–5.1)
SODIUM: 142 mmol/L (ref 135–145)

## 2016-09-12 LAB — CBC WITH DIFFERENTIAL/PLATELET
BASOS ABS: 0 10*3/uL (ref 0.0–0.1)
BASOS PCT: 0 %
EOS ABS: 0.1 10*3/uL (ref 0.0–0.7)
Eosinophils Relative: 1 %
HEMATOCRIT: 29.5 % — AB (ref 39.0–52.0)
Hemoglobin: 9.3 g/dL — ABNORMAL LOW (ref 13.0–17.0)
Lymphocytes Relative: 8 %
Lymphs Abs: 0.8 10*3/uL (ref 0.7–4.0)
MCH: 29.3 pg (ref 26.0–34.0)
MCHC: 31.5 g/dL (ref 30.0–36.0)
MCV: 93.1 fL (ref 78.0–100.0)
MONO ABS: 0.3 10*3/uL (ref 0.1–1.0)
Monocytes Relative: 3 %
NEUTROS ABS: 9 10*3/uL — AB (ref 1.7–7.7)
NEUTROS PCT: 88 %
Platelets: 97 10*3/uL — ABNORMAL LOW (ref 150–400)
RBC: 3.17 MIL/uL — ABNORMAL LOW (ref 4.22–5.81)
RDW: 16.3 % — AB (ref 11.5–15.5)
WBC: 10.3 10*3/uL (ref 4.0–10.5)

## 2016-09-12 LAB — VANCOMYCIN, TROUGH: Vancomycin Tr: 29 ug/mL (ref 15–20)

## 2016-09-13 ENCOUNTER — Other Ambulatory Visit (HOSPITAL_COMMUNITY): Payer: Medicare Other

## 2016-09-13 ENCOUNTER — Other Ambulatory Visit: Payer: Self-pay | Admitting: Emergency Medicine

## 2016-09-13 DIAGNOSIS — J9620 Acute and chronic respiratory failure, unspecified whether with hypoxia or hypercapnia: Secondary | ICD-10-CM

## 2016-09-13 DIAGNOSIS — I25110 Atherosclerotic heart disease of native coronary artery with unstable angina pectoris: Secondary | ICD-10-CM | POA: Diagnosis not present

## 2016-09-13 LAB — RENAL FUNCTION PANEL
ALBUMIN: 2.3 g/dL — AB (ref 3.5–5.0)
Anion gap: 8 (ref 5–15)
BUN: 87 mg/dL — AB (ref 6–20)
CALCIUM: 8.2 mg/dL — AB (ref 8.9–10.3)
CO2: 31 mmol/L (ref 22–32)
CREATININE: 4.53 mg/dL — AB (ref 0.61–1.24)
Chloride: 101 mmol/L (ref 101–111)
GFR calc Af Amer: 12 mL/min — ABNORMAL LOW (ref >60)
GFR calc non Af Amer: 11 mL/min — ABNORMAL LOW (ref >60)
GLUCOSE: 109 mg/dL — AB (ref 65–99)
PHOSPHORUS: 4.5 mg/dL (ref 2.5–4.6)
Potassium: 3.3 mmol/L — ABNORMAL LOW (ref 3.5–5.1)
SODIUM: 140 mmol/L (ref 135–145)

## 2016-09-13 LAB — CBC
HEMATOCRIT: 27.9 % — AB (ref 39.0–52.0)
Hemoglobin: 8.9 g/dL — ABNORMAL LOW (ref 13.0–17.0)
MCH: 29.1 pg (ref 26.0–34.0)
MCHC: 31.9 g/dL (ref 30.0–36.0)
MCV: 91.2 fL (ref 78.0–100.0)
PLATELETS: 101 10*3/uL — AB (ref 150–400)
RBC: 3.06 MIL/uL — ABNORMAL LOW (ref 4.22–5.81)
RDW: 16.2 % — AB (ref 11.5–15.5)
WBC: 8.6 10*3/uL (ref 4.0–10.5)

## 2016-09-13 LAB — MAGNESIUM: MAGNESIUM: 2.4 mg/dL (ref 1.7–2.4)

## 2016-09-13 NOTE — Consult Note (Signed)
[facility] CONSULT NOTE    Date: 09/13/2016                  Patient Name:  Sean Arellano  MRN: [id number]  DOB: 27-Mar-1929  Age / Sex: 83 y.o., male         PCP: Patient, No Pcp Per                 Service Requesting Consult: Hospitalist                 Reason for Consult: Acute renal failure            History of Present Illness: Patient is a 83 y.o. male with a PMHx of Obstructive sleep apnea, recent cardiac arrest, COPD, who was admitted to [facility] on 08/31/2016 for ongoing treatment of respiratory failure. He was admitted to the referring hospital on 08/19/2016 for acute on chronic hypercarbic respiratory failure secondary to COPD exacerbation. He also was felt to have community acquired pneumonia at that time. Unfortunately the patient deteriorated and suffered cardiopulmonary arrest secondary to torsades and ventricular fibrillation requiring CPR. He had further cardiac workup including left heart cardiac catheterization on 08/25/2016. A stent was placed in his left circumflex artery at that time.  The patient's baseline creatinine appears to be 0.8 on 09/07/2016.  Creatinine started rising on 09/10/2016. He was noted as having a very elevated vancomycin level of 45. Thereafter he had another level on 09/12/2016 which was 29. Patient was provided with IV fluid hydration over the weekend. Creatinine is currently 4.5. Patient is making urine.   Medications: Outpatient medications: Prescriptions Prior to Admission  Medication Sig Dispense Refill Last Dose  . amLODipine (NORVASC) 10 MG tablet Take 1 tablet by mouth daily.   08/19/2015 at Unknown time  . aspirin 81 MG tablet Take 81 mg by mouth daily.   08/20/2015 at Unknown time  . cloNIDine (CATAPRES) 0.1 MG tablet Take 1 tablet (0.1 mg total) by mouth 3 (three) times daily. 90 tablet 11   . fenofibrate (TRICOR) 145 MG tablet Take 1 tablet by mouth daily.   08/20/2015 at Unknown time  . hydrALAZINE  (APRESOLINE) 10 MG tablet Take 1 tablet (10 mg total) by mouth 3 (three) times daily. 90 tablet 11   . nicotine (NICODERM CQ - DOSED IN MG/24 HR) 7 mg/24hr patch Place 1 patch (7 mg total) onto the skin daily. 28 patch 0   . predniSONE (DELTASONE) 10 MG tablet Take 4 tabs  daily with food x 4 days, then 3 tabs daily x 4 days, then 2 tabs daily x 4 days, then 1 tab daily x4 days then stop. #40 40 tablet 0   . SYMBICORT 160-4.5 MCG/ACT inhaler Inhale 2 puffs into the lungs daily.   08/20/2015 at Unknown time  . tiotropium (SPIRIVA) 18 MCG inhalation capsule Place 1 capsule (18 mcg total) into inhaler and inhale daily. 30 capsule 12     Current medications: No current facility-administered medications for this encounter.       Allergies: No Known Allergies    Past Medical History: Past Medical History:  Diagnosis Date  . COPD (chronic obstructive pulmonary disease) (HCC)   . High cholesterol   . Hypertension   . Smoker    unsure of ppd      Past Surgical History: No past surgical history on file.   Family History: Unable to obtain family history from the patient as he is currently on  the ventilator.  Social History: Unable to obtain from the patient as he is currently on the ventilator.  Review of Systems: Unable to obtain from the patient as he is currently on the ventilator.  Vital Signs: Temperature 98.5 pulse 94 respirations 22 blood pressure 132/66  Weight trends: There were no vitals filed for this visit.  Physical Exam: General: Criticaly ill appearing  Head: Normocephalic, atraumatic.  Eyes: Anicteric, EOMI  Nose: Mucous membranes moist, not inflammed, nonerythematous.  Throat: Oropharynx nonerythematous, no exudate appreciated.   Neck: Supple, trachea midline.  Lungs:  Scattered rhonchi, breath sounds vent assisted  Heart: Irregular, no rubs  Abdomen:  BS normoactive. Soft, Nondistended, non-tender.  No masses or organomegaly.  Extremities: No pretibial  edema.  Neurologic: Awake, alert, will follow simple commands, on vent  Skin: No visible rashes, scars.    Lab results: Basic Metabolic Panel:  Recent Labs Lab 09/09/16 0650 09/10/16 0603 09/11/16 0615 09/12/16 1000 09/13/16 0709  NA  --  144 141 142 140  K  --  3.6 4.0 3.0* 3.3*  CL  --  101 100* 98* 101  CO2  --  31 30 32 31  GLUCOSE  --  177* 152* 121* 109*  BUN  --  60* 70* 79* 87*  CREATININE  --  3.82* 4.31* 4.44* 4.53*  CALCIUM  --  7.9* 7.9* 8.1* 8.2*  MG 2.2 2.4 2.4 2.6* 2.4  PHOS  --  4.1 4.8* 4.3 4.5    Liver Function Tests:  Recent Labs Lab 09/11/16 0615 09/12/16 1000 09/13/16 0709  ALBUMIN 2.4* 2.1* 2.3*   No results for input(s): LIPASE, AMYLASE in the last 168 hours. No results for input(s): AMMONIA in the last 168 hours.  CBC:  Recent Labs Lab 09/09/16 0650 09/10/16 0603 09/11/16 0923 09/12/16 1000 09/13/16 0709  WBC 20.3* 18.0* 14.7* 10.3 8.6  NEUTROABS 19.3*  --   --  9.0*  --   HGB 9.5* 8.8* 9.3* 9.3* 8.9*  HCT 30.4* 28.5* 29.8* 29.5* 27.9*  MCV 95.6 93.4 93.1 93.1 91.2  PLT 92* 93* 98* 97* 101*    Cardiac Enzymes:  Recent Labs Lab 09/08/16 1540 09/08/16 2142 09/09/16 0357 09/09/16 0957 09/10/16 0603 09/10/16 1027  CKTOTAL 41* 47* 42* 39* 32*  --   CKMB 0.9 1.2 1.1 1.0 1.3  --   TROPONINI 0.82* 1.01* 0.94* 0.77*  --  0.71*    BNP: Invalid input(s): POCBNP  CBG: No results for input(s): GLUCAP in the last 168 hours.  Microbiology: Results for orders placed or performed during the hospital encounter of 08/31/16  Culture, Urine     Status: None   Collection Time: 09/06/16  1:02 PM  Result Value Ref Range Status   Specimen Description URINE, RANDOM  Final   Special Requests NONE  Final   Culture NO GROWTH  Final   Report Status 09/08/2016 FINAL  Final  Culture, respiratory (NON-Expectorated)     Status: None   Collection Time: 09/08/16  4:19 PM  Result Value Ref Range Status   Specimen Description TRACHEAL  ASPIRATE  Final   Special Requests NONE  Final   Gram Stain   Final    ABUNDANT WBC PRESENT,BOTH PMN AND MONONUCLEAR NO ORGANISMS SEEN    Culture   Final    RARE FUNGUS (MOLD) ISOLATED, PROBABLE CONTAMINANT/COLONIZER (SAPROPHYTE). CONTACT MICROBIOLOGY IF FURTHER IDENTIFICATION REQUIRED [phone].   Report Status 09/11/2016 FINAL  Final  C difficile quick scan w PCR reflex  Status: None   Collection Time: 09/09/16  8:20 AM  Result Value Ref Range Status   C Diff antigen NEGATIVE NEGATIVE Final   C Diff toxin NEGATIVE NEGATIVE Final   C Diff interpretation No C. difficile detected.  Final  Culture, blood (routine x 2)     Status: None (Preliminary result)   Collection Time: 09/10/16 11:18 AM  Result Value Ref Range Status   Specimen Description BLOOD RIGHT HAND  Final   Special Requests IN PEDIATRIC BOTTLE Blood Culture adequate volume  Final   Culture NO GROWTH 2 DAYS  Final   Report Status PENDING  Incomplete  Culture, blood (routine x 2)     Status: None (Preliminary result)   Collection Time: 09/10/16 11:18 AM  Result Value Ref Range Status   Specimen Description BLOOD RIGHT HAND  Final   Special Requests IN PEDIATRIC BOTTLE Blood Culture adequate volume  Final   Culture NO GROWTH 2 DAYS  Final   Report Status PENDING  Incomplete    Coagulation Studies: No results for input(s): LABPROT, INR in the last 72 hours.  Urinalysis: No results for input(s): COLORURINE, LABSPEC, PHURINE, GLUCOSEU, HGBUR, BILIRUBINUR, KETONESUR, PROTEINUR, UROBILINOGEN, NITRITE, LEUKOCYTESUR in the last 72 hours.  Invalid input(s): APPERANCEUR    Imaging: Dg Chest Port 1 View  Result Date: 09/13/2016 CLINICAL DATA:  Respiratory failure. EXAM: PORTABLE CHEST 1 VIEW COMPARISON:  09/09/2016 . FINDINGS: Endotracheal tube, NG tube, right PICC line stable position. Stable cardiomegaly. Interim persistent bilateral pulmonary infiltrates/edema. Persistent bilateral pleural effusions. Left  costophrenic angle incompletely imaged. Chest is unchanged prior exam. No pneumothorax . IMPRESSION: 1. Lines and tubes in stable position. 2. Stable cardiomegaly. Interim persistent bilateral pulmonary infiltrates/edema. Persistent bilateral pleural effusions. No change from prior exam. Electronically Signed   By: Maisie Fus  Register   On: 09/13/2016 07:07   Dg Abd Portable 1v  Result Date: 09/12/2016 CLINICAL DATA:  Ileus. EXAM: PORTABLE ABDOMEN - 1 VIEW COMPARISON:  09/07/2016 FINDINGS: Enteric tube with tip in the left upper quadrant consistent with location in the upper stomach. Mildly prominent gas-filled small and large bowel without distention consistent with mild ileus. No radiopaque stones. Surgical clips in the right upper quadrant. Degenerative changes in the lumbar spine and hips. Vascular calcifications. IMPRESSION: Enteric tube tip projects to the upper stomach. Gas-filled nondistended small and large bowel consistent with mild ileus. Electronically Signed   By: Burman Nieves M.D.   On: 09/12/2016 23:28      Assessment & Plan: Pt is a 83 y.o. male with a PMHx of Obstructive sleep apnea, recent cardiac arrest, COPD, who was admitted to [facility] on 08/31/2016 for ongoing treatment of respiratory failure. He was admitted to the referring hospital on 08/19/2016 for acute on chronic hypercarbic respiratory failure secondary to COPD exacerbation. He also was felt to have community acquired pneumonia at that time. Unfortunately the patient deteriorated and suffered cardiopulmonary arrest secondary to torsades and ventricular fibrillation requiring CPR.  We are asked to see the patient for evaluation management of acute renal failure which is most likely secondary to vancomycin toxicity. Patient was also receiving diuretics and is on lisinopril.  1. Acute renal failure secondary to vancomycin toxicity, ACE inhibitor use, and dehydration secondary to diuretics. 2. Hypokalemia. 3. Acute  respiratory failure. 4. Anemia unspecified.  Plan: The patient's acute renal failure appears to be multifactorial with contributions from vancomycin toxicity, ACE inhibitor use, and dehydration. Agree with ongoing IV fluid hydration. The patient is still making some  urine therefore no urgent indication for dialysis at the moment. Avoid further nephrotoxins. Patient has been taken off of vancomycin and is currently on linezolid. We will also obtain a renal ultrasound to make sure there is no underlying obstruction. Continue potassium repletion protocol as potassium does appear to be coming up. Vent management as per pulmonary/critical care. Further plan as patient progresses. Thanks for consultation.

## 2016-09-13 NOTE — Consult Note (Signed)
Cardiology Consultation:   Patient ID: Sean Arellano; [id number]; [birth year]   Admit date: 08/31/2016 Date of Consult: 09/13/2016  Primary Care Provider: Patient, No Pcp Per Primary Cardiologist: Dr. Sharol RousselJunagadhwalla Primary Electrophysiologist:  None   Patient Profile:   Sean Arellano is a 83 y.o. male with a hx of  COPD OSA, recent cardiac arrest, hypercholesterolemia, smoker, hypertension .  The patient is being seen today for the evaluation of antiplatelet therapy  at the request of Dr. Elesa MassedHijaz.  History of Present Illness:   [facility] In brief [facility] was [facility] 09/[facility]/06 for ongoing treatment of respiratory failure. He was admitted from a referring hospital on 7/05 for acute on chronic hypercarbic res failure second to COPD exacerbation. Also concern for possible community acquired pneumonia. Per chart review he deteriorated and had a cardiopulmonary arrest secondary to torsades  And ventricular fibrillation. He required CPR. He had a cardiac catheterization done on 08/25/2016 and a stent was placed in his left circumflex artery. His creatinine at baseline is 0.8 (7/24)  Patient required intubation due to poor pulmonary function on 7/24 and is still on a vent but awake and responsive. They have decided that he needs a tracheostomy and is agreeable to this.  The plan is for trach later this week.  Past Medical History:  Diagnosis Date  . COPD (chronic obstructive pulmonary disease) (HCC)   . High cholesterol   . Hypertension   . Smoker    unsure of ppd     No past surgical history on file.   Inpatient Medications: Scheduled Meds:  Continuous Infusions:  PRN Meds:   Allergies:   No Known Allergies  Social History:   Social History   Social History  . Marital status: Unknown    Spouse name: N/A  . Number of children: N/A  . Years of education: N/A   Occupational History  . Not on file.   Social History Main Topics   . Smoking status: Current Every Day Smoker    Types: Cigarettes  . Smokeless tobacco: Not on file  . Alcohol use Not on file  . Drug use: Unknown  . Sexual activity: Not on file   Other Topics Concern  . Not on file   Social History Narrative  . No narrative on file      Family History:  Per chart review, no pertinent family history of any chronic conditions per the patient.  Unable to discuss family hx with patient at this time as he is intubated and on a ventilator.   ROS:  Unable to discuss ROS with patient at this time as he is intubated and on a ventilator.   Physical Exam/Data:     General: critically ill appearing Head: Normocephalic, atraumatic, Lungs: Intubated and on ventilator  Heart: irregular without murmur Abdomen: Soft, non-tender, non-distended Msk:  Strength and tone appear normal for age. Extremities: no LE edema Neuro: Alert and will follow commands Psych:  Responds to questions appropriately with a normal affect.  EKG:  The EKG 7/25 was personally reviewed and demonstrates sinus rhythm with sinus arrhythmia. Occasional PVCs   Relevant CV Studies:   Transthoracic echocardiogram 08/23/2016 Study Conclusions  - Left ventricle: The cavity size was mildly dilated. There was   mild concentric hypertrophy. Systolic function was normal. The   estimated ejection fraction was in the range of 60% to 65%. Wall   motion was grossly normal, though there was poor endocardial  border definition even with Definity contrast; there were no   regional wall motion abnormalities. Doppler parameters are   consistent with abnormal left ventricular relaxation (grade 1   diastolic dysfunction). - Aortic valve: Transvalvular velocity was within the normal range.   There was no stenosis. There was no regurgitation. - Mitral valve: There was no regurgitation. - Left atrium: The atrium was severely dilated. - Right ventricle: The cavity size was normal. Wall thickness  was   normal. Systolic function was normal. - Right atrium: The atrium was moderately to severely dilated. - Atrial septum: No defect or patent foramen ovale was identified   by color flow Doppler. - Tricuspid valve: There was trivial regurgitation.  Laboratory Data:  Chemistry Recent Labs Lab 09/11/16 0615 09/12/16 1000 09/13/16 0709  NA 141 142 140  K 4.0 3.0* 3.3*  CL 100* 98* 101  CO2 30 32 31  GLUCOSE 152* 121* 109*  BUN 70* 79* 87*  CREATININE 4.31* 4.44* 4.53*  CALCIUM 7.9* 8.1* 8.2*  GFRNONAA 11* 11* 11*  GFRAA 13* 13* 12*  ANIONGAP 11 12 8      Recent Labs Lab 09/11/16 0615 09/12/16 1000 09/13/16 0709  ALBUMIN 2.4* 2.1* 2.3*   Hematology Recent Labs Lab 09/11/16 0923 09/12/16 1000 09/13/16 0709  WBC 14.7* 10.3 8.6  RBC 3.20* 3.17* 3.06*  HGB 9.3* 9.3* 8.9*  HCT 29.8* 29.5* 27.9*  MCV 93.1 93.1 91.2  MCH 29.1 29.3 29.1  MCHC 31.2 31.5 31.9  RDW 16.1* 16.3* 16.2*  PLT 98* 97* 101*   Cardiac Enzymes Recent Labs Lab 09/08/16 1540 09/08/16 2142 09/09/16 0357 09/09/16 0957 09/10/16 1027  TROPONINI 0.82* 1.01* 0.94* 0.77* 0.71*   No results for input(s): TROPIPOC in the last 168 hours.   Radiology/Studies:  Dg Chest Port 1 View  Result Date: 09/13/2016 CLINICAL DATA:  Respiratory failure. EXAM: PORTABLE CHEST 1 VIEW COMPARISON:  09/09/2016 . FINDINGS: Endotracheal tube, NG tube, right PICC line stable position. Stable cardiomegaly. Interim persistent bilateral pulmonary infiltrates/edema. Persistent bilateral pleural effusions. Left costophrenic angle incompletely imaged. Chest is unchanged prior exam. No pneumothorax . IMPRESSION: 1. Lines and tubes in stable position. 2. Stable cardiomegaly. Interim persistent bilateral pulmonary infiltrates/edema. Persistent bilateral pleural effusions. No change from prior exam. Electronically Signed   By: Maisie Fushomas  Register   On: 09/13/2016 07:07   Dg Abd Portable 1v  Result Date: 09/12/2016 CLINICAL DATA:   Ileus. EXAM: PORTABLE ABDOMEN - 1 VIEW COMPARISON:  09/07/2016 FINDINGS: Enteric tube with tip in the left upper quadrant consistent with location in the upper stomach. Mildly prominent gas-filled small and large bowel without distention consistent with mild ileus. No radiopaque stones. Surgical clips in the right upper quadrant. Degenerative changes in the lumbar spine and hips. Vascular calcifications. IMPRESSION: Enteric tube tip projects to the upper stomach. Gas-filled nondistended small and large bowel consistent with mild ileus. Electronically Signed   By: Burman NievesWilliam  Stevens M.D.   On: 09/12/2016 23:28    Assessment and Plan:   1. Acute on Chronic respiratory failure: Recent MI on 7/11 with stent placed. He is on Brilinta but now needs a tracheotomy.  -- Recommend holding the Brilinta and starting Aspirin prior to procedure.,  The patient is at a very high risk of having another cardiac event or stroke given recent stent placement.   Suan HalterSigned, GREENE,TIFFANY G, PA-C  09/13/2016 5:07 PM   Attending Note:   The patient was seen and examined.  Agree with assessment and plan as noted above.  Changes made to the above note as needed.  Patient seen and independently examined with Marlon Pel, PA .   We discussed all aspects of the encounter. I agree with the assessment and plan as stated above.  1.   CAD :   Pt just had stenting in [facility]. He was placed on brilinta and ASA. He has been on the vent for a week and the team needs to place a tracheostomy. We have been asked to discuss risks.  I'm assuming that the tracheostomy cannot be done while on Brilinta.     He is at high risk for CV complications if we DC the Brilinta this soon after a stent placement.   The Brilinta should be held for 5 days prior to the procedure and restarted as soon after the procedure as possible .     I would recommend that he  be placed on ASA while he is off the Brinilta .This will provide some  antiplatelet activity.     I have spent a total of 40 minutes with patient reviewing hospital  notes , telemetry, EKGs, labs and examining patient as well as establishing an assessment and plan that was discussed with the patient. > 50% of time was spent in direct patient care.  Will sign off. Call for questions or concerns.   Vesta Mixer, Montez Hageman., MD, [facility] 09/13/2016, 5:56 PM [address],  [address] Office [phone] Pager [phone]

## 2016-09-14 LAB — BASIC METABOLIC PANEL
Anion gap: 9 (ref 5–15)
BUN: 81 mg/dL — AB (ref 6–20)
CHLORIDE: 103 mmol/L (ref 101–111)
CO2: 30 mmol/L (ref 22–32)
CREATININE: 4.22 mg/dL — AB (ref 0.61–1.24)
Calcium: 8 mg/dL — ABNORMAL LOW (ref 8.9–10.3)
GFR calc Af Amer: 14 mL/min — ABNORMAL LOW (ref >60)
GFR, EST NON AFRICAN AMERICAN: 12 mL/min — AB (ref >60)
GLUCOSE: 106 mg/dL — AB (ref 65–99)
Potassium: 3.3 mmol/L — ABNORMAL LOW (ref 3.5–5.1)
SODIUM: 142 mmol/L (ref 135–145)

## 2016-09-14 LAB — MAGNESIUM: MAGNESIUM: 2.5 mg/dL — AB (ref 1.7–2.4)

## 2016-09-15 ENCOUNTER — Other Ambulatory Visit (HOSPITAL_COMMUNITY): Payer: Medicare Other

## 2016-09-15 LAB — CULTURE, BLOOD (ROUTINE X 2)
CULTURE: NO GROWTH
Culture: NO GROWTH
Special Requests: ADEQUATE
Special Requests: ADEQUATE

## 2016-09-15 LAB — POTASSIUM: POTASSIUM: 4.1 mmol/L (ref 3.5–5.1)

## 2016-09-15 NOTE — Progress Notes (Signed)
[facility]  ROUNDING NOTE   Subjective:  Patient seen at bedside. BUN and creatinine were slightly better yesterday. No new renal function testing today. Next line patient still appears to be making good urine however. Patient for tracheostomy placement tomorrow.   Objective:  Vital signs in last 24 hours:  Temperature 98.4 pulse 76 respirations 22 blood pressure 134/65  Physical Exam: General: No acute distress  Head: Endotracheal tube noted to be in place   Eyes: Anicteric  Neck: Supple, trachea midline  Lungs:  Scattered rhonchi, normal effort  Heart: S1S2 no rubs  Abdomen:  Soft, nontender, bowel sounds present  Extremities: Trace peripheral edema.  Neurologic: Awake, alert, following commands, On the vent   Skin: No lesions       Basic Metabolic Panel:  Recent Labs Lab 09/09/16 0646  09/10/16 0603 09/11/16 0615 09/12/16 1000 09/13/16 0709 09/14/16 0527 09/15/16 0551  NA 145  --  144 141 142 140 142  --   K 3.5  --  3.6 4.0 3.0* 3.3* 3.3* 4.1  CL 101  --  101 100* 98* 101 103  --   CO2 34*  --  31 30 32 31 30  --   GLUCOSE 222*  --  177* 152* 121* 109* 106*  --   BUN 51*  --  60* 70* 79* 87* 81*  --   CREATININE 3.11*  --  3.82* 4.31* 4.44* 4.53* 4.22*  --   CALCIUM 8.1*  --  7.9* 7.9* 8.1* 8.2* 8.0*  --   MG  --   < > 2.4 2.4 2.6* 2.4 2.5*  --   PHOS 2.8  --  4.1 4.8* 4.3 4.5  --   --   < > = values in this interval not displayed.  Liver Function Tests:  Recent Labs Lab 09/09/16 0646 09/10/16 0603 09/11/16 0615 09/12/16 1000 09/13/16 0709  ALBUMIN 2.4* 2.1* 2.4* 2.1* 2.3*   No results for input(s): LIPASE, AMYLASE in the last 168 hours. No results for input(s): AMMONIA in the last 168 hours.  CBC:  Recent Labs Lab 09/09/16 0650 09/10/16 0603 09/11/16 0923 09/12/16 1000 09/13/16 0709  WBC 20.3* 18.0* 14.7* 10.3 8.6  NEUTROABS 19.3*  --   --  9.0*  --   HGB 9.5* 8.8* 9.3* 9.3* 8.9*  HCT 30.4* 28.5* 29.8* 29.5* 27.9*  MCV  95.6 93.4 93.1 93.1 91.2  PLT 92* 93* 98* 97* 101*    Cardiac Enzymes:  Recent Labs Lab 09/08/16 2142 09/09/16 0357 09/09/16 0957 09/10/16 0603 09/10/16 1027  CKTOTAL 47* 42* 39* 32*  --   CKMB 1.2 1.1 1.0 1.3  --   TROPONINI 1.01* 0.94* 0.77*  --  0.71*    BNP: Invalid input(s): POCBNP  CBG: No results for input(s): GLUCAP in the last 168 hours.  Microbiology: Results for orders placed or performed during the hospital encounter of 08/31/16  Culture, Urine     Status: None   Collection Time: 09/06/16  1:02 PM  Result Value Ref Range Status   Specimen Description URINE, RANDOM  Final   Special Requests NONE  Final   Culture NO GROWTH  Final   Report Status 09/08/2016 FINAL  Final  Culture, respiratory (NON-Expectorated)     Status: None   Collection Time: 09/08/16  4:19 PM  Result Value Ref Range Status   Specimen Description TRACHEAL ASPIRATE  Final   Special Requests NONE  Final   Gram Stain   Final  ABUNDANT WBC PRESENT,BOTH PMN AND MONONUCLEAR NO ORGANISMS SEEN    Culture   Final    RARE FUNGUS (MOLD) ISOLATED, PROBABLE CONTAMINANT/COLONIZER (SAPROPHYTE). CONTACT MICROBIOLOGY IF FURTHER IDENTIFICATION REQUIRED [phone].   Report Status 09/11/2016 FINAL  Final  C difficile quick scan w PCR reflex     Status: None   Collection Time: 09/09/16  8:20 AM  Result Value Ref Range Status   C Diff antigen NEGATIVE NEGATIVE Final   C Diff toxin NEGATIVE NEGATIVE Final   C Diff interpretation No C. difficile detected.  Final  Culture, blood (routine x 2)     Status: None   Collection Time: 09/10/16 11:18 AM  Result Value Ref Range Status   Specimen Description BLOOD RIGHT HAND  Final   Special Requests IN PEDIATRIC BOTTLE Blood Culture adequate volume  Final   Culture NO GROWTH 5 DAYS  Final   Report Status 09/15/2016 FINAL  Final  Culture, blood (routine x 2)     Status: None   Collection Time: 09/10/16 11:18 AM  Result Value Ref Range Status   Specimen  Description BLOOD RIGHT HAND  Final   Special Requests IN PEDIATRIC BOTTLE Blood Culture adequate volume  Final   Culture NO GROWTH 5 DAYS  Final   Report Status 09/15/2016 FINAL  Final    Coagulation Studies: No results for input(s): LABPROT, INR in the last 72 hours.  Urinalysis: No results for input(s): COLORURINE, LABSPEC, PHURINE, GLUCOSEU, HGBUR, BILIRUBINUR, KETONESUR, PROTEINUR, UROBILINOGEN, NITRITE, LEUKOCYTESUR in the last 72 hours.  Invalid input(s): APPERANCEUR    Imaging: US Renal  Result Date: 09/13/2016 CLINICAL DATA:  Acute renal failure.  Patient is intubated. EXAM: RENAL / URINARY TRACT ULTRASOUND COMPLETE COMPARISON:  None. FINDINGS: Right Kidney: Length: 12.1 cm. Benign-appearing simple cyst in the upper pole right kidney measuring 2 cm maximal diameter. No hydronephrosis or solid mass identified. Left Kidney: Length: 11 cm. Echogenicity within normal limits. No mass or hydronephrosis visualized. Bladder: Bladder is decompressed and cannot be evaluated. IMPRESSION: No hydronephrosis or acute process demonstrated in the kidneys. Benign-appearing cyst on the right kidney. Bladder is decompressed and cannot be evaluated. Electronically Signed   By: Burman Nieves M.D.   On: 09/13/2016 22:44   Dg Chest Port 1 View  Result Date: 09/15/2016 CLINICAL DATA:  Low O2 sats EXAM: PORTABLE CHEST 1 VIEW COMPARISON:  09/13/2016 FINDINGS: Layering bilateral pleural effusions with bilateral lower lobe opacities. Cardiomegaly. Support devices including endotracheal tube are unchanged. IMPRESSION: No significant change. Electronically Signed   By: Charlett Nose M.D.   On: 09/15/2016 15:03   Dg Abd Portable 1v  Result Date: 09/15/2016 CLINICAL DATA:  NG tube placement today. EXAM: PORTABLE ABDOMEN - 1 VIEW COMPARISON:  Single-view of the abdomen earlier today. FINDINGS: NG tube is in place with the side-port in the stomach in good position. IMPRESSION: As above. Electronically Signed   By:  Drusilla Kanner M.D.   On: 09/15/2016 15:02   Dg Abd Portable 1v  Result Date: 09/15/2016 CLINICAL DATA:  Ileus. EXAM: PORTABLE ABDOMEN - 1 VIEW COMPARISON:  Ultrasound 09/13/2016. KUB 09/12/2016, 09/07/2016, 08/21/2015. FINDINGS: Interim removal of NG tube. Surgical clips right upper quadrant. Soft tissue structures are unremarkable. No bowel distention. No free air. Degenerative changes lumbar spine and both hips. Prominent lumbar spine scoliosis concave right. Aortoiliac atherosclerotic vascular calcification . IMPRESSION: Interim removal of NG tube.  No acute abnormality identified. Electronically Signed   By: Maisie Fus  Register   On: 09/15/2016 07:36  Medications:       Assessment/ Plan:  83 y.o. male with a PMHx of Obstructive sleep apnea, recent cardiac arrest, COPD, who was admitted to [facility] on 08/31/2016 for ongoing treatment of respiratory failure. He was admitted to the referring hospital on 08/19/2016 for acute on chronic hypercarbic respiratory failure secondary to COPD exacerbation. He also was felt to have community acquired pneumonia at that time. Unfortunately the patient deteriorated and suffered cardiopulmonary arrest secondary to torsades and ventricular fibrillation requiring CPR.  We are asked to see the patient for evaluation management of acute renal failure which is most likely secondary to vancomycin toxicity. Patient was also receiving diuretics and is on lisinopril.  1. Acute renal failure secondary to vancomycin toxicity, ACE inhibitor use, and dehydration secondary to diuretics. 2. Hypokalemia. 3. Acute respiratory failure. 4. Anemia unspecified.  Plan: No new renal function testing today. However patient is making adequate urine output. Therefore no urgent indication for dialysis at the moment. However if renal function declines we may need to consider this temporarily. Patient for tracheostomy placement tomorrow. Recommend continued renal function  monitoring for now. Avoid nephrotoxins as possible.   LOS: 0 Sean Arellano 8/1/20184:08 PM

## 2016-09-16 LAB — CBC
HCT: 29.9 % — ABNORMAL LOW (ref 39.0–52.0)
Hemoglobin: 9.5 g/dL — ABNORMAL LOW (ref 13.0–17.0)
MCH: 29.4 pg (ref 26.0–34.0)
MCHC: 31.8 g/dL (ref 30.0–36.0)
MCV: 92.6 fL (ref 78.0–100.0)
PLATELETS: 108 10*3/uL — AB (ref 150–400)
RBC: 3.23 MIL/uL — AB (ref 4.22–5.81)
RDW: 16.4 % — ABNORMAL HIGH (ref 11.5–15.5)
WBC: 6.4 10*3/uL (ref 4.0–10.5)

## 2016-09-16 LAB — COMPREHENSIVE METABOLIC PANEL
ALT: 29 U/L (ref 17–63)
AST: 23 U/L (ref 15–41)
Albumin: 2.3 g/dL — ABNORMAL LOW (ref 3.5–5.0)
Alkaline Phosphatase: 49 U/L (ref 38–126)
Anion gap: 10 (ref 5–15)
BUN: 76 mg/dL — AB (ref 6–20)
CHLORIDE: 109 mmol/L (ref 101–111)
CO2: 28 mmol/L (ref 22–32)
CREATININE: 3.91 mg/dL — AB (ref 0.61–1.24)
Calcium: 8.2 mg/dL — ABNORMAL LOW (ref 8.9–10.3)
GFR calc Af Amer: 15 mL/min — ABNORMAL LOW (ref >60)
GFR, EST NON AFRICAN AMERICAN: 13 mL/min — AB (ref >60)
Glucose, Bld: 102 mg/dL — ABNORMAL HIGH (ref 65–99)
Potassium: 3.8 mmol/L (ref 3.5–5.1)
SODIUM: 147 mmol/L — AB (ref 135–145)
Total Bilirubin: 1 mg/dL (ref 0.3–1.2)
Total Protein: 5 g/dL — ABNORMAL LOW (ref 6.5–8.1)

## 2016-09-17 ENCOUNTER — Encounter: Payer: Self-pay | Admitting: Anesthesiology

## 2016-09-17 ENCOUNTER — Encounter (HOSPITAL_COMMUNITY): Payer: Medicare Other | Admitting: Certified Registered"

## 2016-09-17 ENCOUNTER — Encounter
Admission: RE | Disposition: A | Discharge status: Skilled Nursing Facility | Payer: Self-pay | Source: Other Acute Inpatient Hospital | Attending: Internal Medicine

## 2016-09-17 HISTORY — PX: TRACHEOSTOMY TUBE PLACEMENT: SHX814

## 2016-09-17 LAB — COMPREHENSIVE METABOLIC PANEL
ALK PHOS: 65 U/L (ref 38–126)
ALT: 28 U/L (ref 17–63)
ANION GAP: 9 (ref 5–15)
AST: 19 U/L (ref 15–41)
Albumin: 2.3 g/dL — ABNORMAL LOW (ref 3.5–5.0)
BILIRUBIN TOTAL: 1 mg/dL (ref 0.3–1.2)
BUN: 77 mg/dL — ABNORMAL HIGH (ref 6–20)
CALCIUM: 8.4 mg/dL — AB (ref 8.9–10.3)
CO2: 30 mmol/L (ref 22–32)
Chloride: 106 mmol/L (ref 101–111)
Creatinine, Ser: 3.81 mg/dL — ABNORMAL HIGH (ref 0.61–1.24)
GFR calc Af Amer: 15 mL/min — ABNORMAL LOW (ref >60)
GFR, EST NON AFRICAN AMERICAN: 13 mL/min — AB (ref >60)
GLUCOSE: 106 mg/dL — AB (ref 65–99)
Potassium: 3.8 mmol/L (ref 3.5–5.1)
Sodium: 145 mmol/L (ref 135–145)
TOTAL PROTEIN: 5 g/dL — AB (ref 6.5–8.1)

## 2016-09-17 LAB — CBC
HCT: 27.3 % — ABNORMAL LOW (ref 39.0–52.0)
HEMOGLOBIN: 8.6 g/dL — AB (ref 13.0–17.0)
MCH: 28.8 pg (ref 26.0–34.0)
MCHC: 31.5 g/dL (ref 30.0–36.0)
MCV: 91.3 fL (ref 78.0–100.0)
Platelets: 106 10*3/uL — ABNORMAL LOW (ref 150–400)
RBC: 2.99 MIL/uL — AB (ref 4.22–5.81)
RDW: 16.3 % — ABNORMAL HIGH (ref 11.5–15.5)
WBC: 6.3 10*3/uL (ref 4.0–10.5)

## 2016-09-17 SURGERY — CREATION, TRACHEOSTOMY
Anesthesia: General | Site: Neck

## 2016-09-17 MED ORDER — 0.9 % SODIUM CHLORIDE (POUR BTL) OPTIME
TOPICAL | Status: DC | PRN
  Administered 2016-09-17: 1000 mL

## 2016-09-17 MED ORDER — DEXAMETHASONE SODIUM PHOSPHATE 10 MG/ML IJ SOLN
INTRAMUSCULAR | Status: DC | PRN
  Administered 2016-09-17: 10 mg via INTRAVENOUS

## 2016-09-17 MED ORDER — ROCURONIUM BROMIDE 10 MG/ML (PF) SYRINGE
PREFILLED_SYRINGE | INTRAVENOUS | Status: DC | PRN
  Administered 2016-09-17: 60 mg via INTRAVENOUS

## 2016-09-17 MED ORDER — MIDAZOLAM HCL 5 MG/5ML IJ SOLN
INTRAMUSCULAR | Status: DC | PRN
  Administered 2016-09-17: 2 mg via INTRAVENOUS

## 2016-09-17 MED ORDER — LACTATED RINGERS IV SOLN
INTRAVENOUS | Status: DC | PRN
  Administered 2016-09-17: 09:00:00 via INTRAVENOUS

## 2016-09-17 MED ORDER — LIDOCAINE-EPINEPHRINE 1 %-1:100000 IJ SOLN
INTRAMUSCULAR | Status: DC | PRN
  Administered 2016-09-17: 5 mL

## 2016-09-17 MED ORDER — DEXAMETHASONE SODIUM PHOSPHATE 10 MG/ML IJ SOLN
INTRAMUSCULAR | Status: AC
  Filled 2016-09-17: qty 1

## 2016-09-17 MED ORDER — PHENYLEPHRINE HCL 10 MG/ML IJ SOLN
INTRAVENOUS | Status: DC | PRN
  Administered 2016-09-17: 40 ug/min via INTRAVENOUS

## 2016-09-17 MED ORDER — FENTANYL CITRATE (PF) 250 MCG/5ML IJ SOLN
INTRAMUSCULAR | Status: AC
  Filled 2016-09-17: qty 5

## 2016-09-17 MED ORDER — SUGAMMADEX SODIUM 200 MG/2ML IV SOLN
INTRAVENOUS | Status: AC
  Filled 2016-09-17: qty 2

## 2016-09-17 MED ORDER — PHENYLEPHRINE 40 MCG/ML (10ML) SYRINGE FOR IV PUSH (FOR BLOOD PRESSURE SUPPORT)
PREFILLED_SYRINGE | INTRAVENOUS | Status: DC | PRN
  Administered 2016-09-17: 80 ug via INTRAVENOUS
  Administered 2016-09-17: 160 ug via INTRAVENOUS

## 2016-09-17 MED ORDER — SUGAMMADEX SODIUM 200 MG/2ML IV SOLN
INTRAVENOUS | Status: DC | PRN
  Administered 2016-09-17: 200 mg via INTRAVENOUS

## 2016-09-17 MED ORDER — LIDOCAINE-EPINEPHRINE 1 %-1:100000 IJ SOLN
INTRAMUSCULAR | Status: AC
  Filled 2016-09-17: qty 1

## 2016-09-17 MED ORDER — PHENYLEPHRINE 40 MCG/ML (10ML) SYRINGE FOR IV PUSH (FOR BLOOD PRESSURE SUPPORT)
PREFILLED_SYRINGE | INTRAVENOUS | Status: AC
  Filled 2016-09-17: qty 10

## 2016-09-17 MED ORDER — MIDAZOLAM HCL 2 MG/2ML IJ SOLN
INTRAMUSCULAR | Status: AC
  Filled 2016-09-17: qty 2

## 2016-09-17 MED ORDER — ROCURONIUM BROMIDE 10 MG/ML (PF) SYRINGE
PREFILLED_SYRINGE | INTRAVENOUS | Status: AC
  Filled 2016-09-17: qty 5

## 2016-09-17 MED ORDER — PROPOFOL 10 MG/ML IV BOLUS
INTRAVENOUS | Status: AC
  Filled 2016-09-17: qty 20

## 2016-09-17 SURGICAL SUPPLY — 40 items
ATTRACTOMAT 16X20 MAGNETIC DRP (DRAPES) IMPLANT
BLADE SURG 15 STRL LF DISP TIS (BLADE) ×1 IMPLANT
BLADE SURG 15 STRL SS (BLADE) ×2
CLEANER TIP ELECTROSURG 2X2 (MISCELLANEOUS) ×3 IMPLANT
COVER SURGICAL LIGHT HANDLE (MISCELLANEOUS) ×3 IMPLANT
DRAPE HALF SHEET 40X57 (DRAPES) IMPLANT
ELECT COATED BLADE 2.86 ST (ELECTRODE) ×3 IMPLANT
ELECT REM PT RETURN 9FT ADLT (ELECTROSURGICAL) ×3
ELECTRODE REM PT RTRN 9FT ADLT (ELECTROSURGICAL) ×1 IMPLANT
GAUZE SPONGE 4X4 16PLY XRAY LF (GAUZE/BANDAGES/DRESSINGS) ×3 IMPLANT
GEL ULTRASOUND 20GR AQUASONIC (MISCELLANEOUS) ×3 IMPLANT
GLOVE SS BIOGEL STRL SZ 7.5 (GLOVE) ×1 IMPLANT
GLOVE SUPERSENSE BIOGEL SZ 7.5 (GLOVE) ×2
GOWN STRL REUS W/ TWL LRG LVL3 (GOWN DISPOSABLE) ×1 IMPLANT
GOWN STRL REUS W/ TWL XL LVL3 (GOWN DISPOSABLE) ×1 IMPLANT
GOWN STRL REUS W/TWL LRG LVL3 (GOWN DISPOSABLE) ×2
GOWN STRL REUS W/TWL XL LVL3 (GOWN DISPOSABLE) ×2
HOLDER TRACH TUBE VELCRO 19.5 (MISCELLANEOUS) ×3 IMPLANT
KIT BASIN OR (CUSTOM PROCEDURE TRAY) ×3 IMPLANT
KIT ROOM TURNOVER OR (KITS) ×3 IMPLANT
KIT SUCTION CATH 14FR (SUCTIONS) ×3 IMPLANT
NEEDLE HYPO 25GX1X1/2 BEV (NEEDLE) IMPLANT
NS IRRIG 1000ML POUR BTL (IV SOLUTION) ×3 IMPLANT
PACK EENT II TURBAN DRAPE (CUSTOM PROCEDURE TRAY) ×3 IMPLANT
PAD ARMBOARD 7.5X6 YLW CONV (MISCELLANEOUS) ×6 IMPLANT
PENCIL BUTTON HOLSTER BLD 10FT (ELECTRODE) ×3 IMPLANT
SPONGE DRAIN TRACH 4X4 STRL 2S (GAUZE/BANDAGES/DRESSINGS) ×3 IMPLANT
SPONGE INTESTINAL PEANUT (DISPOSABLE) ×3 IMPLANT
SUT SILK 2 0 SH CR/8 (SUTURE) ×3 IMPLANT
SUT SILK 3 0 TIES 10X30 (SUTURE) IMPLANT
SYR 5ML LUER SLIP (SYRINGE) ×3 IMPLANT
SYR CONTROL 10ML LL (SYRINGE) ×3 IMPLANT
TOWEL OR 17X24 6PK STRL BLUE (TOWEL DISPOSABLE) ×3 IMPLANT
TOWEL OR 17X26 10 PK STRL BLUE (TOWEL DISPOSABLE) ×3 IMPLANT
TUBE CONNECTING 12'X1/4 (SUCTIONS) ×1
TUBE CONNECTING 12X1/4 (SUCTIONS) ×2 IMPLANT
TUBE TRACH 7.0 EXL PROX CUF (TUBING) IMPLANT
TUBE TRACH SHILEY  6 DIST  CUF (TUBING) IMPLANT
TUBE TRACH SHILEY 10 DIST CUFF (TUBING) IMPLANT
TUBE TRACH SHILEY 8 DIST CUF (TUBING) ×3 IMPLANT

## 2016-09-17 NOTE — Progress Notes (Signed)
[facility]  ROUNDING NOTE   Subjective:  Patient seen at bedside. Awaiting tracheostomy placement today. Renal function appears to be improving.   Objective:  Vital signs in last 24 hours:  Pulse 67 respirations 18 blood pressure 146/72 pulse ox 98%  Physical Exam: General: No acute distress  Head: Endotracheal tube noted to be in place   Eyes: Anicteric  Neck: Supple, trachea midline  Lungs:  Scattered rhonchi, normal effort  Heart: S1S2 no rubs  Abdomen:  Soft, nontender, bowel sounds present  Extremities: Trace peripheral edema.  Neurologic: Awake, alert, following commands, On the vent   Skin: No lesions       Basic Metabolic Panel:  Recent Labs Lab 09/11/16 0615 09/12/16 1000 09/13/16 0709 09/14/16 0527 09/15/16 0551 09/16/16 0415 09/17/16 0542  NA 141 142 140 142  --  147* 145  K 4.0 3.0* 3.3* 3.3* 4.1 3.8 3.8  CL 100* 98* 101 103  --  109 106  CO2 30 32 31 30  --  28 30  GLUCOSE 152* 121* 109* 106*  --  102* 106*  BUN 70* 79* 87* 81*  --  76* 77*  CREATININE 4.31* 4.44* 4.53* 4.22*  --  3.91* 3.81*  CALCIUM 7.9* 8.1* 8.2* 8.0*  --  8.2* 8.4*  MG 2.4 2.6* 2.4 2.5*  --   --   --   PHOS 4.8* 4.3 4.5  --   --   --   --     Liver Function Tests:  Recent Labs Lab 09/11/16 0615 09/12/16 1000 09/13/16 0709 09/16/16 0415 09/17/16 0542  AST  --   --   --  23 19  ALT  --   --   --  29 28  ALKPHOS  --   --   --  49 65  BILITOT  --   --   --  1.0 1.0  PROT  --   --   --  5.0* 5.0*  ALBUMIN 2.4* 2.1* 2.3* 2.3* 2.3*   No results for input(s): LIPASE, AMYLASE in the last 168 hours. No results for input(s): AMMONIA in the last 168 hours.  CBC:  Recent Labs Lab 09/11/16 0923 09/12/16 1000 09/13/16 0709 09/16/16 0415 09/17/16 0542  WBC 14.7* 10.3 8.6 6.4 6.3  NEUTROABS  --  9.0*  --   --   --   HGB 9.3* 9.3* 8.9* 9.5* 8.6*  HCT 29.8* 29.5* 27.9* 29.9* 27.3*  MCV 93.1 93.1 91.2 92.6 91.3  PLT 98* 97* 101* 108* 106*    Cardiac  Enzymes:  Recent Labs Lab 09/10/16 1027  TROPONINI 0.71*    BNP: Invalid input(s): POCBNP  CBG: No results for input(s): GLUCAP in the last 168 hours.  Microbiology: Results for orders placed or performed during the hospital encounter of 08/31/16  Culture, Urine     Status: None   Collection Time: 09/06/16  1:02 PM  Result Value Ref Range Status   Specimen Description URINE, RANDOM  Final   Special Requests NONE  Final   Culture NO GROWTH  Final   Report Status 09/08/2016 FINAL  Final  Culture, respiratory (NON-Expectorated)     Status: None   Collection Time: 09/08/16  4:19 PM  Result Value Ref Range Status   Specimen Description TRACHEAL ASPIRATE  Final   Special Requests NONE  Final   Gram Stain   Final    ABUNDANT WBC PRESENT,BOTH PMN AND MONONUCLEAR NO ORGANISMS SEEN    Culture  Final    RARE FUNGUS (MOLD) ISOLATED, PROBABLE CONTAMINANT/COLONIZER (SAPROPHYTE). CONTACT MICROBIOLOGY IF FURTHER IDENTIFICATION REQUIRED [phone] [phone].   Report Status 09/11/2016 FINAL  Final  C difficile quick scan w PCR reflex     Status: None   Collection Time: 09/09/16  8:20 AM  Result Value Ref Range Status   C Diff antigen NEGATIVE NEGATIVE Final   C Diff toxin NEGATIVE NEGATIVE Final   C Diff interpretation No C. difficile detected.  Final  Culture, blood (routine x 2)     Status: None   Collection Time: 09/10/16 11:18 AM  Result Value Ref Range Status   Specimen Description BLOOD RIGHT HAND  Final   Special Requests IN PEDIATRIC BOTTLE Blood Culture adequate volume  Final   Culture NO GROWTH 5 DAYS  Final   Report Status 09/15/2016 FINAL  Final  Culture, blood (routine x 2)     Status: None   Collection Time: 09/10/16 11:18 AM  Result Value Ref Range Status   Specimen Description BLOOD RIGHT HAND  Final   Special Requests IN PEDIATRIC BOTTLE Blood Culture adequate volume  Final   Culture NO GROWTH 5 DAYS  Final   Report Status 09/15/2016 FINAL  Final    Coagulation  Studies: No results for input(s): LABPROT, INR in the last 72 hours.  Urinalysis: No results for input(s): COLORURINE, LABSPEC, PHURINE, GLUCOSEU, HGBUR, BILIRUBINUR, KETONESUR, PROTEINUR, UROBILINOGEN, NITRITE, LEUKOCYTESUR in the last 72 hours.  Invalid input(s): APPERANCEUR    Imaging: Dg Chest Port 1 View  Result Date: 09/15/2016 CLINICAL DATA:  Low O2 sats EXAM: PORTABLE CHEST 1 VIEW COMPARISON:  09/13/2016 FINDINGS: Layering bilateral pleural effusions with bilateral lower lobe opacities. Cardiomegaly. Support devices including endotracheal tube are unchanged. IMPRESSION: No significant change. Electronically Signed   By: Charlett NoseKevin  Dover M.D.   On: 09/15/2016 15:03   Dg Abd Portable 1v  Result Date: 09/15/2016 CLINICAL DATA:  NG tube placement today. EXAM: PORTABLE ABDOMEN - 1 VIEW COMPARISON:  Single-view of the abdomen earlier today. FINDINGS: NG tube is in place with the side-port in the stomach in good position. IMPRESSION: As above. Electronically Signed   By: Drusilla Kannerhomas  Dalessio M.D.   On: 09/15/2016 15:02     Medications:       Assessment/ Plan:  83 y.o. male with a PMHx of Obstructive sleep apnea, recent cardiac arrest, COPD, who was admitted to [facility] on 08/31/2016 for ongoing treatment of respiratory failure. He was admitted to the referring hospital on 08/19/2016 for acute on chronic hypercarbic respiratory failure secondary to COPD exacerbation. He also was felt to have community acquired pneumonia at that time. Unfortunately the patient deteriorated and suffered cardiopulmonary arrest secondary to torsades and ventricular fibrillation requiring CPR.  We are asked to see the patient for evaluation management of acute renal failure which is most likely secondary to vancomycin toxicity. Patient was also receiving diuretics and is on lisinopril.  1. Acute renal failure secondary to vancomycin toxicity, ACE inhibitor use, and dehydration secondary to diuretics. 2.  Hypokalemia. 3. Acute respiratory failure. 4. Anemia unspecified.  Plan: Creatinine is down slightly to 3.81. Patient is still making urine at this point in time. No urgent indication for dialysis currently. Patient due for tracheostomy placement today. We will continue to monitor the patient's renal function next week. Otherwise continue supportive care and avoid nephrotoxins as possible.   LOS: 0 Margene Sean Arellano 8/3/20188:52 AM

## 2016-09-17 NOTE — Transfer of Care (Signed)
Immediate Anesthesia Transfer of Care Note  Patient: Sean Arellano  Procedure(s) Performed: Procedure(s): TRACHEOSTOMY (N/A)  Patient Location: PACU and select unit  Anesthesia Type:General  Level of Consciousness: awake and confused  Airway & Oxygen Therapy: Patient Spontanous Breathing and Patient placed on Ventilator (see vital sign flow sheet for setting)  Post-op Assessment: Report given to RN and Post -op Vital signs reviewed and stable  Post vital signs: Reviewed and stable  Last Vitals: There were no vitals filed for this visit.  Last Pain: There were no vitals filed for this visit.       Complications: No apparent anesthesia complications

## 2016-09-17 NOTE — Brief Op Note (Signed)
08/31/2016 - 09/17/2016  10:21 AM  PATIENT:  Sean Arellano  83 y.o. male  PRE-OPERATIVE DIAGNOSIS:  RESPIRATORY FAILURE  POST-OPERATIVE DIAGNOSIS:  RESPIRATORY FAILURE  PROCEDURE:  Procedure(s): TRACHEOSTOMY (N/A) with # 8 Shiley Cuffed  SURGEON:  Surgeon(s) and Role:    Drema Halon* Newman, Christopher E, MD - Primary  PHYSICIAN ASSISTANT:   ASSISTANTS: none   ANESTHESIA:   general  EBL:  No intake/output data recorded.  BLOOD ADMINISTERED:none  DRAINS: none   LOCAL MEDICATIONS USED:  XYLOCAINE with Epi 5 cc  SPECIMEN:  No Specimen  DISPOSITION OF SPECIMEN:  N/A  COUNTS:  YES  TOURNIQUET:  * No tourniquets in log *  DICTATION: .Other Dictation: Dictation Number [id number]  PLAN OF CARE: Discharge to home after PACU  PATIENT DISPOSITION:  PACU - hemodynamically stable.   Delay start of Pharmacological VTE agent (>24hrs) due to surgical blood loss or risk of bleeding: not applicable

## 2016-09-17 NOTE — H&P (Signed)
PREOPERATIVE H&P  Chief Complaint: acute on chronic respiratory failure  HPI: Sean Arellano is a 83 y.o. male who presents for evaluation of acute on chronic respiratory failure. Patient with history of COPD, CHF and OSA. Patient was recently admitted to outside hospital on 7/5 because of pneumonia and respiratory failure. He had MI during his hospitalization and had a stent place on 7/11. [facility] and was subsequently intubated on 7/24. I was subsequently consulted for consideration of trach placement on 7/30. Patient is intubated and on vent.  Past Medical History:  Diagnosis Date  . COPD (chronic obstructive pulmonary disease) (HCC)   . High cholesterol   . Hypertension   . Smoker    unsure of ppd    History reviewed. No pertinent surgical history. Social History   Social History  . Marital status: Unknown    Spouse name: N/A  . Number of children: N/A  . Years of education: N/A   Social History Main Topics  . Smoking status: Current Every Day Smoker    Types: Cigarettes  . Smokeless tobacco: None  . Alcohol use None  . Drug use: Unknown  . Sexual activity: Not Asked   Other Topics Concern  . None   Social History Narrative  . None   History reviewed. No pertinent family history. No Known Allergies Prior to Admission medications   Medication Sig Start Date End Date Taking? Authorizing Provider  amLODipine (NORVASC) 10 MG tablet Take 1 tablet by mouth daily. 08/11/15   [provider]  aspirin 81 MG tablet Take 81 mg by mouth daily.    [provider]  cloNIDine (CATAPRES) 0.1 MG tablet Take 1 tablet (0.1 mg total) by mouth 3 (three) times daily. 08/29/15   Simonne MartinetBabcock, Peter E, NP  fenofibrate (TRICOR) 145 MG tablet Take 1 tablet by mouth daily. 08/09/15   [provider]  hydrALAZINE (APRESOLINE) 10 MG tablet Take 1 tablet (10 mg total) by mouth 3 (three) times daily. 08/29/15   Simonne MartinetBabcock, Peter E, NP  nicotine (NICODERM CQ -  DOSED IN MG/24 HR) 7 mg/24hr patch Place 1 patch (7 mg total) onto the skin daily. 08/29/15   Simonne MartinetBabcock, Peter E, NP  predniSONE (DELTASONE) 10 MG tablet Take 4 tabs  daily with food x 4 days, then 3 tabs daily x 4 days, then 2 tabs daily x 4 days, then 1 tab daily x4 days then stop. #40 08/29/15   Simonne MartinetBabcock, Peter E, NP  SYMBICORT 160-4.5 MCG/ACT inhaler Inhale 2 puffs into the lungs daily. 07/12/15   [provider]  tiotropium (SPIRIVA) 18 MCG inhalation capsule Place 1 capsule (18 mcg total) into inhaler and inhale daily. 08/29/15   Simonne MartinetBabcock, Peter E, NP     Positive ROS: neg  All other systems have been reviewed and were otherwise negative with the exception of those mentioned in the HPI and as above.  Physical Exam: There were no vitals filed for this visit.  General: Alert and intubated on vent Nasal: Clear nasal passages Neck: No palpable adenopathy or thyroid nodules Cardiovascular: Regular rate and rhythm, no murmur.  Respiratory: Clear to auscultation   Assessment/Plan: RESPIRATORY FAILURE Plan for Procedure(s): TRACHEOSTOMY   Dillard CannonHRISTOPHER Drevon Plog, MD 09/17/2016 8:57 AM

## 2016-09-17 NOTE — Anesthesia Preprocedure Evaluation (Addendum)
Anesthesia Evaluation  Patient identified by MRN, date of birth, ID band Patient awake    Reviewed: Allergy & Precautions, NPO status , Patient's Chart, lab work & pertinent test results  Airway Mallampati: Intubated  TM Distance: >3 FB Neck ROM: Full    Dental no notable dental hx.    Pulmonary COPD,  COPD inhaler, Current Smoker,  Acute respiratory failure   Pulmonary exam normal  + decreased breath sounds      Cardiovascular hypertension, Pt. on medications Normal cardiovascular exam Rhythm:Regular Rate:Normal     Neuro/Psych negative neurological ROS  negative psych ROS   GI/Hepatic negative GI ROS, Neg liver ROS,   Endo/Other  Malnutrition  Renal/GU Renal InsufficiencyRenal disease  negative genitourinary   Musculoskeletal negative musculoskeletal ROS (+)   Abdominal   Peds  Hematology  (+) anemia , Thrombocytopenia   Anesthesia Other Findings   Reproductive/Obstetrics                            Anesthesia Physical Anesthesia Plan  ASA: IV  Anesthesia Plan: General   Post-op Pain Management:    Induction: Intravenous  PONV Risk Score and Plan: 3 and Ondansetron, Dexamethasone, Propofol infusion, Treatment may vary due to age or medical condition and Promethazine  Airway Management Planned: Tracheostomy and Oral ETT  Additional Equipment:   Intra-op Plan:   Post-operative Plan:   Informed Consent: I have reviewed the patients History and Physical, chart, labs and discussed the procedure including the risks, benefits and alternatives for the proposed anesthesia with the patient or authorized representative who has indicated his/her understanding and acceptance.     Plan Discussed with: CRNA, Anesthesiologist and Surgeon  Anesthesia Plan Comments:         Anesthesia Quick Evaluation

## 2016-09-17 NOTE — Anesthesia Postprocedure Evaluation (Signed)
Anesthesia Post Note  Patient: Sean Arellano  Procedure(s) Performed: Procedure(s) (LRB): TRACHEOSTOMY (N/A)     Patient location during evaluation: PACU Anesthesia Type: General Level of consciousness: patient remains intubated per anesthesia plan Pain management: pain level controlled Vital Signs Assessment: post-procedure vital signs reviewed and stable Respiratory status: spontaneous breathing, nonlabored ventilation, respiratory function stable and patient connected to tracheostomy mask oxygen Cardiovascular status: blood pressure returned to baseline and stable Postop Assessment: no signs of nausea or vomiting Anesthetic complications: no Comments: Patient taken to SICU with O2 via tracheostomy by [facility]. Full monitors used for transport.    Last Vitals: There were no vitals filed for this visit.  Last Pain: There were no vitals filed for this visit.               Sean Cashatt A.

## 2016-09-17 NOTE — Op Note (Signed)
NAME:  [name], [name]                ACCOUNT NO.:  [id number]  MEDICAL RECORD NO.:  [id number]  LOCATION:                                 FACILITY:  PHYSICIAN:  Kristine GarbeChristopher E. Ezzard StandingNewman, M.D. DATE OF BIRTH:  DATE OF PROCEDURE:  09/17/2016 DATE OF DISCHARGE:                              OPERATIVE REPORT   PREOPERATIVE DIAGNOSIS:  Acute on chronic respiratory failure.  POSTOPERATIVE DIAGNOSIS:  Acute on chronic respiratory failure.  OPERATION PERFORMED:  Tracheotomy with a #8 Shiley cuffed tube.  SURGEON:  Kristine GarbeChristopher E. Ezzard StandingNewman, M.D.  ANESTHESIA:  General endotracheal.  COMPLICATIONS:  None.  BRIEF CLINICAL NOTE:  Sean MylarJames Arellano is an 83 year old gentleman, who has a long history of COPD, obstructive sleep apnea, and respiratory problems.  He was admitted to an outside hospital 3-4 weeks ago in respiratory failure after having pneumonia, requiring intubation. During this hospitalization, he had an MI requiring a stent placement. He was subsequently transferred [facility] over 2 weeks ago and required intubation a little over 10 days ago and has been intubated ever since.  He is presently on the vent and they recommended placement of tracheotomy tube.  The patient is taken to operating room at this time for tracheostomy tube placement.  DESCRIPTION OF PROCEDURE:  The patient was brought directly from [facility] down to the operating room.  He remained in his bed.  Shoulder roll was placed underneath the neck.  The neck was prepped with Betadine solution and draped out with sterile towels.  A vertical incision was made midline over the trachea just above the suprasternal notch.  Dissection was carried down through the subcutaneous tissue with cautery.  Strap muscles were identified and were divided in midline and retracted laterally.  Thyroid isthmus was identified and was divided with the cautery.  The first two tracheal rings were exposed with a  cricoid hook through the cricoid cartilage. Horizontal incision was made between the first and second tracheal rings.  The endotracheal tube was removed and a #8 Shiley cuffed tube was inserted without difficulty.  There was minimal bleeding.  The patient was ventilated well.  Tracheostomy tube was secured with 2-0 silk sutures x4 and Velcro trach collar around the neck.  This completed the procedure.  The patient will be subsequently transferred back to [facility].          ______________________________ Kristine Garbehristopher E. Ezzard StandingNewman, M.D.     CEN/MEDQ  D:  09/17/2016  T:  09/17/2016  Job:  [id number]

## 2016-09-20 ENCOUNTER — Other Ambulatory Visit (HOSPITAL_COMMUNITY): Payer: Medicare Other

## 2016-09-20 ENCOUNTER — Encounter (HOSPITAL_COMMUNITY): Payer: Self-pay | Admitting: Otolaryngology

## 2016-09-20 NOTE — Progress Notes (Signed)
[facility]  ROUNDING NOTE   Subjective:   Trach placed. Patient able to answer yes and no questions.    Objective:  Vital signs in last 24 hours:  T 95.7 p 90 bp 136/63   Physical Exam: General: No acute distress  Head: [place]/AT, dry mucosal membranes, NG tube  Eyes: Anicteric  Neck: +tracheostomy  Lungs:  Clear, no SPV   Heart: S1S2 no rubs  Abdomen:  Soft, nontender, bowel sounds present  Extremities: No peripheral edema.  Neurologic: Awake, alert, following commands,   Skin: No lesions  GU: +urine in foley    Basic Metabolic Panel:  Recent Labs Lab 09/14/16 0527 09/15/16 0551 09/16/16 0415 09/17/16 0542  NA 142  --  147* 145  K 3.3* 4.1 3.8 3.8  CL 103  --  109 106  CO2 30  --  28 30  GLUCOSE 106*  --  102* 106*  BUN 81*  --  76* 77*  CREATININE 4.22*  --  3.91* 3.81*  CALCIUM 8.0*  --  8.2* 8.4*  MG 2.5*  --   --   --     Liver Function Tests:  Recent Labs Lab 09/16/16 0415 09/17/16 0542  AST 23 19  ALT 29 28  ALKPHOS 49 65  BILITOT 1.0 1.0  PROT 5.0* 5.0*  ALBUMIN 2.3* 2.3*   No results for input(s): LIPASE, AMYLASE in the last 168 hours. No results for input(s): AMMONIA in the last 168 hours.  CBC:  Recent Labs Lab 09/16/16 0415 09/17/16 0542  WBC 6.4 6.3  HGB 9.5* 8.6*  HCT 29.9* 27.3*  MCV 92.6 91.3  PLT 108* 106*    Cardiac Enzymes: No results for input(s): CKTOTAL, CKMB, CKMBINDEX, TROPONINI in the last 168 hours.  BNP: Invalid input(s): POCBNP  CBG: No results for input(s): GLUCAP in the last 168 hours.  Microbiology: Results for orders placed or performed during the hospital encounter of 08/31/16  Culture, Urine     Status: None   Collection Time: 09/06/16  1:02 PM  Result Value Ref Range Status   Specimen Description URINE, RANDOM  Final   Special Requests NONE  Final   Culture NO GROWTH  Final   Report Status 09/08/2016 FINAL  Final  Culture, respiratory (NON-Expectorated)     Status: None   Collection Time: 09/08/16  4:19 PM  Result Value Ref Range Status   Specimen Description TRACHEAL ASPIRATE  Final   Special Requests NONE  Final   Gram Stain   Final    ABUNDANT WBC PRESENT,BOTH PMN AND MONONUCLEAR NO ORGANISMS SEEN    Culture   Final    RARE FUNGUS (MOLD) ISOLATED, PROBABLE CONTAMINANT/COLONIZER (SAPROPHYTE). CONTACT MICROBIOLOGY IF FURTHER IDENTIFICATION REQUIRED [phone].   Report Status 09/11/2016 FINAL  Final  C difficile quick scan w PCR reflex     Status: None   Collection Time: 09/09/16  8:20 AM  Result Value Ref Range Status   C Diff antigen NEGATIVE NEGATIVE Final   C Diff toxin NEGATIVE NEGATIVE Final   C Diff interpretation No C. difficile detected.  Final  Culture, blood (routine x 2)     Status: None   Collection Time: 09/10/16 11:18 AM  Result Value Ref Range Status   Specimen Description BLOOD RIGHT HAND  Final   Special Requests IN PEDIATRIC BOTTLE Blood Culture adequate volume  Final   Culture NO GROWTH 5 DAYS  Final   Report Status 09/15/2016 FINAL  Final  Culture, blood (routine  x 2)     Status: None   Collection Time: 09/10/16 11:18 AM  Result Value Ref Range Status   Specimen Description BLOOD RIGHT HAND  Final   Special Requests IN PEDIATRIC BOTTLE Blood Culture adequate volume  Final   Culture NO GROWTH 5 DAYS  Final   Report Status 09/15/2016 FINAL  Final    Coagulation Studies: No results for input(s): LABPROT, INR in the last 72 hours.  Urinalysis: No results for input(s): COLORURINE, LABSPEC, PHURINE, GLUCOSEU, HGBUR, BILIRUBINUR, KETONESUR, PROTEINUR, UROBILINOGEN, NITRITE, LEUKOCYTESUR in the last 72 hours.  Invalid input(s): APPERANCEUR    Imaging: No results found.   Medications:       Assessment/ Plan:  83 y.o. black male with a PMHx of Obstructive sleep apnea, recent cardiac arrest, COPD, who was admitted to [facility] on 08/31/2016 for ongoing treatment of respiratory failure. He was admitted to the  referring hospital on 08/19/2016 for acute on chronic hypercarbic respiratory failure secondary to COPD exacerbation. He also was felt to have community acquired pneumonia at that time. Unfortunately the patient deteriorated and suffered cardiopulmonary arrest secondary to torsades and ventricular fibrillation requiring CPR.  We are asked to see the patient for evaluation management of acute renal failure which is most likely secondary to vancomycin toxicity. Patient was also receiving diuretics and is on lisinopril.  1. Acute renal failure: secondary to vancomycin toxicity, ACE inhibitor use, and dehydration secondary to diuretics.  2. Hypertension: better controlled - amlodipine, imdur, and metoprolol  3. Anemia with renal failure: hemoglobin 8.6 - consider aranesp  4. Acute Respiratory Failure: on mechanical ventilation.  - now with tracheostomy on spontaneous ventilation.    LOS: 0 Sean Arellano 8/6/20184:48 PM

## 2016-09-21 ENCOUNTER — Other Ambulatory Visit (HOSPITAL_COMMUNITY): Payer: Medicare Other

## 2016-09-21 LAB — CBC
HCT: 26.2 % — ABNORMAL LOW (ref 39.0–52.0)
HEMOGLOBIN: 8.2 g/dL — AB (ref 13.0–17.0)
MCH: 29.3 pg (ref 26.0–34.0)
MCHC: 31.3 g/dL (ref 30.0–36.0)
MCV: 93.6 fL (ref 78.0–100.0)
PLATELETS: 69 10*3/uL — AB (ref 150–400)
RBC: 2.8 MIL/uL — ABNORMAL LOW (ref 4.22–5.81)
RDW: 16.7 % — AB (ref 11.5–15.5)
WBC: 7.6 10*3/uL (ref 4.0–10.5)

## 2016-09-21 LAB — RENAL FUNCTION PANEL
ALBUMIN: 2.3 g/dL — AB (ref 3.5–5.0)
Anion gap: 10 (ref 5–15)
BUN: 77 mg/dL — AB (ref 6–20)
CALCIUM: 8.6 mg/dL — AB (ref 8.9–10.3)
CO2: 31 mmol/L (ref 22–32)
CREATININE: 2.77 mg/dL — AB (ref 0.61–1.24)
Chloride: 108 mmol/L (ref 101–111)
GFR calc Af Amer: 22 mL/min — ABNORMAL LOW (ref >60)
GFR calc non Af Amer: 19 mL/min — ABNORMAL LOW (ref >60)
Glucose, Bld: 115 mg/dL — ABNORMAL HIGH (ref 65–99)
PHOSPHORUS: 3.2 mg/dL (ref 2.5–4.6)
Potassium: 3.6 mmol/L (ref 3.5–5.1)
SODIUM: 149 mmol/L — AB (ref 135–145)

## 2016-09-21 LAB — PROTIME-INR
INR: 1.14
Prothrombin Time: 14.7 seconds (ref 11.4–15.2)

## 2016-09-21 LAB — APTT: aPTT: 27 seconds (ref 24–36)

## 2016-09-21 LAB — MAGNESIUM: Magnesium: 2.2 mg/dL (ref 1.7–2.4)

## 2016-09-21 NOTE — Consult Note (Signed)
Chief Complaint: Patient was seen in consultation today for percutaneous gastric tube placement at the request of Dr Ardeth Sportsman   Supervising Physician: Simonne Come  Patient Status: [name] - In-pt  History of Present Illness: Sean Arellano is a 83 y.o. male   COPD Resp Failure- post pneumonia 7/5 Complications and MI---cardiac cath and stent 08/25/16 Off Brilinta since 7/31 ----trach placed 09/17/16 Admitted 7/17 to [facility] Trach/vent Can answer yes and no questions appropriately Aware of dob; name  Dysphagia Malnutrition Deconditioning Need for long term care Request for percutaneous gastric tube placement  Imaging reviewed with Dr Fredia Sorrow and he approves procedure    Past Medical History:  Diagnosis Date  . COPD (chronic obstructive pulmonary disease) (HCC)   . High cholesterol   . Hypertension   . Smoker    unsure of ppd     Past Surgical History:  Procedure Laterality Date  . TRACHEOSTOMY TUBE PLACEMENT N/A 09/17/2016   Procedure: TRACHEOSTOMY;  Surgeon: Drema Halon, MD;  Location: [facility] OR;  Service: ENT;  Laterality: N/A;    Allergies: Patient has no known allergies.  Medications: Prior to Admission medications   Medication Sig Start Date End Date Taking? Authorizing Provider  amLODipine (NORVASC) 10 MG tablet Take 1 tablet by mouth daily. 08/11/15   [provider]  aspirin 81 MG tablet Take 81 mg by mouth daily.    [provider]  cloNIDine (CATAPRES) 0.1 MG tablet Take 1 tablet (0.1 mg total) by mouth 3 (three) times daily. 08/29/15   Simonne Martinet, NP  fenofibrate (TRICOR) 145 MG tablet Take 1 tablet by mouth daily. 08/09/15   [provider]  hydrALAZINE (APRESOLINE) 10 MG tablet Take 1 tablet (10 mg total) by mouth 3 (three) times daily. 08/29/15   Simonne Martinet, NP  nicotine (NICODERM CQ - DOSED IN MG/24 HR) 7 mg/24hr patch Place 1 patch (7 mg total) onto the skin daily. 08/29/15   Simonne Martinet, NP  predniSONE  (DELTASONE) 10 MG tablet Take 4 tabs  daily with food x 4 days, then 3 tabs daily x 4 days, then 2 tabs daily x 4 days, then 1 tab daily x4 days then stop. #40 08/29/15   Simonne Martinet, NP  SYMBICORT 160-4.5 MCG/ACT inhaler Inhale 2 puffs into the lungs daily. 07/12/15   [provider]  tiotropium (SPIRIVA) 18 MCG inhalation capsule Place 1 capsule (18 mcg total) into inhaler and inhale daily. 08/29/15   Simonne Martinet, NP     History reviewed. No pertinent family history.  Social History   Social History  . Marital status: Unknown    Spouse name: N/A  . Number of children: N/A  . Years of education: N/A   Social History Main Topics  . Smoking status: Current Every Day Smoker    Types: Cigarettes  . Smokeless tobacco: None  . Alcohol use None  . Drug use: Unknown  . Sexual activity: Not Asked   Other Topics Concern  . None   Social History Narrative  . None    Review of Systems: A 12 point ROS discussed and pertinent positives are indicated in the HPI above.  All other systems are negative.  Review of Systems  Respiratory:       Trach/vent  Neurological: Positive for weakness.  Psychiatric/Behavioral: Negative for behavioral problems.    Vital Signs: There were no vitals taken for this visit.  Physical Exam  Cardiovascular: Normal rate and regular rhythm.  Pulmonary/Chest:  Trach/vent  Abdominal: Soft. Bowel sounds are normal.  Musculoskeletal:  Follows all commands and moves all 4s  Neurological: He is alert.  Skin: Skin is warm and dry.  Psychiatric: He has a normal mood and affect. His behavior is normal.  Consented with son Chanetta MarshallJimmy via phone  Nursing note and vitals reviewed.   Mallampati Score:  MD Evaluation Airway: Other (comments) Airway comments: trach Heart: WNL Abdomen: WNL Chest/ Lungs: Other (comments) Chest/ lungs comments: vent ASA  Classification: 3 Mallampati/Airway Score: Three (trach)  Imaging: Ct Abdomen Wo  Contrast  Result Date: 09/20/2016 CLINICAL DATA:  Malnutrition and failure to thrive. Evaluation of anatomy prior to possible percutaneous gastrostomy tube placement. EXAM: CT ABDOMEN WITHOUT CONTRAST TECHNIQUE: Multidetector CT imaging of the abdomen was performed following the standard protocol without IV contrast. COMPARISON:  None. FINDINGS: Lower chest: Lung bases show small bilateral pleural effusions with bibasilar atelectasis. Hepatobiliary: No focal liver abnormality is seen. Status post cholecystectomy. No biliary dilatation. Pancreas: Unremarkable. No pancreatic ductal dilatation or surrounding inflammatory changes. Spleen: Normal in size without focal abnormality. Adrenals/Urinary Tract: Adrenal glands are unremarkable. Kidneys are normal, without renal calculi, focal lesion, or hydronephrosis. Stomach/Bowel: Nasogastric tube is present that extends into the body of the stomach. The stomach is normally positioned and is located superior to the transverse colon. No evidence of bowel obstruction, colonic dilatation, free air or abnormal fluid collection. Vascular/Lymphatic: No enlarged lymph nodes identified. The abdominal aorta is calcified without evidence of aneurysmal dilatation. Other: No ascites.  Mild body wall anasarca present. Musculoskeletal: Degenerative disc disease is present in the visualized lumbar spine. IMPRESSION: Normal gastric anatomy and positioning with no contraindication to percutaneous gastrostomy. Lung bases demonstrate bilateral pleural effusions with bibasilar atelectasis. Nasogastric tube extends into the body of the stomach. Electronically Signed   By: Irish LackGlenn  Yamagata M.D.   On: 09/20/2016 22:50   [place] Renal  Result Date: 09/13/2016 CLINICAL DATA:  Acute renal failure.  Patient is intubated. EXAM: RENAL / URINARY TRACT ULTRASOUND COMPLETE COMPARISON:  None. FINDINGS: Right Kidney: Length: 12.1 cm. Benign-appearing simple cyst in the upper pole right kidney measuring 2 cm  maximal diameter. No hydronephrosis or solid mass identified. Left Kidney: Length: 11 cm. Echogenicity within normal limits. No mass or hydronephrosis visualized. Bladder: Bladder is decompressed and cannot be evaluated. IMPRESSION: No hydronephrosis or acute process demonstrated in the kidneys. Benign-appearing cyst on the right kidney. Bladder is decompressed and cannot be evaluated. Electronically Signed   By: Burman NievesWilliam  Stevens M.D.   On: 09/13/2016 22:44   Dg Chest Port 1 View  Result Date: 09/21/2016 CLINICAL DATA:  Respiratory failure.  COPD. EXAM: PORTABLE CHEST 1 VIEW COMPARISON:  09/15/2016 FINDINGS: Tracheostomy tube is now in place which has replaced the endotracheal tube. Tip is 7.6 cm from the carina. NG tube is stable beyond the gastroesophageal junction. Stable right upper extremity PICC with its tip in the lower SVC. Bilateral pleural effusions, and bilateral central basilar airspace disease are stable. Heart is normal in size. No pneumothorax. IMPRESSION: Tracheostomy tube in place. Bilateral airspace disease and bilateral pleural effusions are not significantly changed. Electronically Signed   By: Jolaine ClickArthur  Hoss M.D.   On: 09/21/2016 07:57   Dg Chest Port 1 View  Result Date: 09/15/2016 CLINICAL DATA:  Low O2 sats EXAM: PORTABLE CHEST 1 VIEW COMPARISON:  09/13/2016 FINDINGS: Layering bilateral pleural effusions with bilateral lower lobe opacities. Cardiomegaly. Support devices including endotracheal tube are unchanged. IMPRESSION: No significant change. Electronically Signed  By: Charlett Nose M.D.   On: 09/15/2016 15:03   Dg Chest Port 1 View  Result Date: 09/13/2016 CLINICAL DATA:  Respiratory failure. EXAM: PORTABLE CHEST 1 VIEW COMPARISON:  09/09/2016 . FINDINGS: Endotracheal tube, NG tube, right PICC line stable position. Stable cardiomegaly. Interim persistent bilateral pulmonary infiltrates/edema. Persistent bilateral pleural effusions. Left costophrenic angle incompletely imaged.  Chest is unchanged prior exam. No pneumothorax . IMPRESSION: 1. Lines and tubes in stable position. 2. Stable cardiomegaly. Interim persistent bilateral pulmonary infiltrates/edema. Persistent bilateral pleural effusions. No change from prior exam. Electronically Signed   By: Maisie Fus  Register   On: 09/13/2016 07:07   Dg Chest Port 1 View  Result Date: 09/09/2016 CLINICAL DATA:  Respiratory failure. EXAM: PORTABLE CHEST 1 VIEW COMPARISON:  09/08/2016.  09/07/2016 . FINDINGS: Endotracheal tube in stable position. NG tube projects below the hemidiaphragms. Right PICC line stable position. Left chest base incompletely imaged. Diffuse bilateral pulmonary infiltrates/edema, particular prominent left again noted. Bilateral pleural effusions cannot be excluded. No pneumothorax. IMPRESSION: 1. Lines and tubes appear to be in stable position. Limited evaluation of the NG tube as the left lower chest incompletely imaged. 2. Cardiomegaly with diffuse bilateral pulmonary infiltrates/ edema, left side greater than right again noted. No interim change. Bilateral pleural effusions without change. Chest is unchanged from prior exam . Electronically Signed   By: Maisie Fus  Register   On: 09/09/2016 07:53   Dg Chest Port 1 View  Result Date: 09/08/2016 CLINICAL DATA:  Respiratory failure.  ETT. EXAM: PORTABLE CHEST 1 VIEW COMPARISON:  September 07, 2016 FINDINGS: No pneumothorax. The NG tube terminates below today's film. The right PICC line and ET tube are in stable position. Bibasilar opacities and probable layering effusions remain. Interstitial opacity remains throughout the entirety of the left lung. No other interval changes. IMPRESSION: 1. Stable support apparatus. 2. Diffuse interstitial infiltrate on the left. Layering effusions with underlying opacities seen bilaterally. Electronically Signed   By: Gerome Sam III M.D   On: 09/08/2016 07:34   Dg Chest Port 1 View  Result Date: 09/07/2016 CLINICAL DATA:  83 year old  male post intubation. Subsequent encounter. EXAM: PORTABLE CHEST 1 VIEW COMPARISON:  09/07/2016 12:25 p.m. FINDINGS: Lower lungs not included on present exam. Endotracheal tube tip 5.6 cm above the carina. Right PICC line tip proximal superior vena cava level. Nasogastric tube in place and not completely imaged. Asymmetric airspace disease with sparing of the right upper lobe similar to prior exam. No obvious pneumothorax. Cardiomegaly. Calcified tortuous aorta. IMPRESSION: Lung bases not completely included on present exam. Endotracheal tube tip 5.6 cm above the carina. Asymmetric airspace disease sparing the right upper lobe unchanged. Electronically Signed   By: Lacy Duverney M.D.   On: 09/07/2016 19:18   Dg Chest Port 1 View  Result Date: 09/07/2016 CLINICAL DATA:  Bedside PICC placement. Follow-up bilateral pneumonia. EXAM: PORTABLE CHEST 1 VIEW 12:25 p.m.: COMPARISON:  Portable chest x-ray earlier today 6:26 a.m., 09/05/2016 and earlier. FINDINGS: Right arm PICC tip projects over the mid SVC. Opacities throughout the left lung and in the right lower lobe are unchanged since the examination 2 days ago. Emphysematous changes in the right upper lobe again noted. IMPRESSION: 1. Right arm PICC tip projects over the mid SVC. 2. Stable bilateral pneumonia. 3.  Emphysema. (ONG29-B28.9) Electronically Signed   By: Hulan Saas M.D.   On: 09/07/2016 12:40   Dg Chest Port 1 View  Result Date: 09/07/2016 CLINICAL DATA:  83 year old male with respiratory failure. COPD.  Pneumonia. Subsequent encounter. EXAM: PORTABLE CHEST 1 VIEW COMPARISON:  09/05/2016.  08/26/2015. FINDINGS: Persistent diffuse bilateral infiltrates with sparing of the right upper lobe and possibly a portion of the right middle lobe. The linear line along the superior margin of the right lower lobe infiltrate may represent fissure rather than result of hydropneumothorax. Recommend follow-up until clearance. Cannot exclude underlying pleural  effusion. Pulmonary vascular congestion. Cardiomegaly. Calcified tortuous aorta. IMPRESSION: Similar appearance of diffuse bilateral infiltrates sparing the right upper lobe. Pulmonary vascular congestion.  Cardiomegaly. Aortic Atherosclerosis (ICD10-I70.0). Electronically Signed   By: Lacy Duverney M.D.   On: 09/07/2016 07:31   Dg Chest Port 1 View  Result Date: 09/05/2016 CLINICAL DATA:  Pneumonia, history COPD, hypertension, smoker EXAM: PORTABLE CHEST 1 VIEW COMPARISON:  Portable exam 0722 hours compared to 09/04/2016 FINDINGS: Normal heart size, mediastinal contours and pulmonary vascularity. Atherosclerotic calcification aorta. Emphysematous and minimal bronchitic changes consistent with COPD. BILATERAL pulmonary infiltrates, slightly improved since previous exam. Bibasilar pleural effusions. No pneumothorax. Bones demineralized. IMPRESSION: COPD changes with slightly improved pulmonary infiltrates. Bibasilar pleural effusions. Aortic Atherosclerosis (ICD10-I70.0) and Emphysema (ICD10-J43.9). Electronically Signed   By: Ulyses Southward M.D.   On: 09/05/2016 08:21   Dg Chest Port 1 View  Result Date: 09/04/2016 CLINICAL DATA:  83 year old male with acute respiratory distress EXAM: PORTABLE CHEST 1 VIEW COMPARISON:  Prior chest x-ray 08/31/2016 FINDINGS: Marked cardiomegaly. Atherosclerotic calcifications are present in the the transverse aorta. Diffuse interstitial prominence throughout the left lung. There are also more confluent bibasilar airspace opacities as well as layering pleural effusions. No pneumothorax. No acute osseous abnormality. IMPRESSION: 1. Interval development of patchy interstitial airspace opacities throughout the left lung compared to 08/31/2016. Differential considerations include developing lobar pneumonia versus asymmetric pulmonary edema (has the patient been position with the left side dependently? ). 2. Above findings are superimposed on persistent bilateral layering pleural  effusions and bibasilar opacities which may reflect atelectasis and/or infiltrate. 3.  Aortic Atherosclerosis (ICD10-170.0) Electronically Signed   By: Malachy Moan M.D.   On: 09/04/2016 09:04   Dg Chest Port 1 View  Result Date: 08/31/2016 CLINICAL DATA:  Respiratory failure EXAM: PORTABLE CHEST 1 VIEW COMPARISON:  08/26/2015 FINDINGS: Normal heart size. Aortic atherosclerosis. Bilateral pleural effusions are. There is diminished aeration to both lung bases. IMPRESSION: 1. Bilateral pleural effusions with diminished aeration to both lung bases. Electronically Signed   By: Signa Kell M.D.   On: 08/31/2016 19:39   Dg Abd Portable 1v  Result Date: 09/15/2016 CLINICAL DATA:  NG tube placement today. EXAM: PORTABLE ABDOMEN - 1 VIEW COMPARISON:  Single-view of the abdomen earlier today. FINDINGS: NG tube is in place with the side-port in the stomach in good position. IMPRESSION: As above. Electronically Signed   By: Drusilla Kanner M.D.   On: 09/15/2016 15:02   Dg Abd Portable 1v  Result Date: 09/15/2016 CLINICAL DATA:  Ileus. EXAM: PORTABLE ABDOMEN - 1 VIEW COMPARISON:  Ultrasound 09/13/2016. KUB 09/12/2016, 09/07/2016, 08/21/2015. FINDINGS: Interim removal of NG tube. Surgical clips right upper quadrant. Soft tissue structures are unremarkable. No bowel distention. No free air. Degenerative changes lumbar spine and both hips. Prominent lumbar spine scoliosis concave right. Aortoiliac atherosclerotic vascular calcification . IMPRESSION: Interim removal of NG tube.  No acute abnormality identified. Electronically Signed   By: Maisie Fus  Register   On: 09/15/2016 07:36   Dg Abd Portable 1v  Result Date: 09/12/2016 CLINICAL DATA:  Ileus. EXAM: PORTABLE ABDOMEN - 1 VIEW COMPARISON:  09/07/2016 FINDINGS: Enteric  tube with tip in the left upper quadrant consistent with location in the upper stomach. Mildly prominent gas-filled small and large bowel without distention consistent with mild ileus. No  radiopaque stones. Surgical clips in the right upper quadrant. Degenerative changes in the lumbar spine and hips. Vascular calcifications. IMPRESSION: Enteric tube tip projects to the upper stomach. Gas-filled nondistended small and large bowel consistent with mild ileus. Electronically Signed   By: Burman Nieves M.D.   On: 09/12/2016 23:28   Dg Abd Portable 1v  Result Date: 09/07/2016 CLINICAL DATA:  Check nasogastric catheter placement EXAM: PORTABLE ABDOMEN - 1 VIEW COMPARISON:  None. FINDINGS: Scattered large and small bowel gas is noted. Nasogastric catheter is noted coiled upon itself within the stomach. The tip appears to be below the diaphragm. IMPRESSION: Nasogastric catheter as described which appears to be coiled within the stomach below the diaphragm. Electronically Signed   By: Alcide Clever M.D.   On: 09/07/2016 16:22    Labs:  CBC:  Recent Labs  09/13/16 0709 09/16/16 0415 09/17/16 0542 09/21/16 0642  WBC 8.6 6.4 6.3 7.6  HGB 8.9* 9.5* 8.6* 8.2*  HCT 27.9* 29.9* 27.3* 26.2*  PLT 101* 108* 106* 69*    COAGS:  Recent Labs  09/21/16 0642  INR 1.14  APTT 27    BMP:  Recent Labs  09/14/16 0527 09/15/16 0551 09/16/16 0415 09/17/16 0542 09/21/16 0642  NA 142  --  147* 145 149*  K 3.3* 4.1 3.8 3.8 3.6  CL 103  --  109 106 108  CO2 30  --  28 30 31   GLUCOSE 106*  --  102* 106* 115*  BUN 81*  --  76* 77* 77*  CALCIUM 8.0*  --  8.2* 8.4* 8.6*  CREATININE 4.22*  --  3.91* 3.81* 2.77*  GFRNONAA 12*  --  13* 13* 19*  GFRAA 14*  --  15* 15* 22*    LIVER FUNCTION TESTS:  Recent Labs  09/01/16 0512  09/13/16 0709 09/16/16 0415 09/17/16 0542 09/21/16 0642  BILITOT 1.8*  --   --  1.0 1.0  --   AST 19  --   --  23 19  --   ALT 30  --   --  29 28  --   ALKPHOS 38  --   --  49 65  --   PROT 5.5*  --   --  5.0* 5.0*  --   ALBUMIN 3.3*  < > 2.3* 2.3* 2.3* 2.3*  < > = values in this interval not displayed.  TUMOR MARKERS: No results for input(s): AFPTM,  CEA, CA199, CHROMGRNA in the last 8760 hours.  Assessment and Plan:  Resp failure Trach/vent Recent MI; cardiac cath/stnent 7/11---OFF Brilinta (LD 7/31) Deconditioning Malnutrition Need for long term care Scheduled for percutaneous gastric tube placement 8/8 in IR Recheck plts (69 today) Risks and benefits discussed with the patient's son via phone including, but not limited to the need for a barium enema during the procedure, bleeding, infection, peritonitis, or damage to adjacent structures. All of his questions were answered, he is agreeable to proceed. Consent signed and in chart.  Thank you for this interesting consult.  I greatly enjoyed meeting Sione Baumgarten and look forward to participating in their care.  A copy of this report was sent to the requesting provider on this date.  Electronically Signed: Robet Leu, PA-C 09/21/2016, 9:48 AM   I spent a total of 40 Minutes    in  face to face in clinical consultation, greater than 50% of which was counseling/coordinating care for percutaneous gastric tube placement

## 2016-09-22 ENCOUNTER — Encounter (HOSPITAL_COMMUNITY): Payer: Self-pay | Admitting: Interventional Radiology

## 2016-09-22 ENCOUNTER — Other Ambulatory Visit (HOSPITAL_COMMUNITY): Payer: Medicare Other

## 2016-09-22 HISTORY — PX: IR GASTROSTOMY TUBE MOD SED: IMG625

## 2016-09-22 LAB — CBC
HCT: 24.5 % — ABNORMAL LOW (ref 39.0–52.0)
Hemoglobin: 7.7 g/dL — ABNORMAL LOW (ref 13.0–17.0)
MCH: 29.1 pg (ref 26.0–34.0)
MCHC: 31.4 g/dL (ref 30.0–36.0)
MCV: 92.5 fL (ref 78.0–100.0)
Platelets: 63 10*3/uL — ABNORMAL LOW (ref 150–400)
RBC: 2.65 MIL/uL — ABNORMAL LOW (ref 4.22–5.81)
RDW: 16.8 % — ABNORMAL HIGH (ref 11.5–15.5)
WBC: 8.1 10*3/uL (ref 4.0–10.5)

## 2016-09-22 LAB — RENAL FUNCTION PANEL
Albumin: 2.4 g/dL — ABNORMAL LOW (ref 3.5–5.0)
Anion gap: 9 (ref 5–15)
BUN: 74 mg/dL — ABNORMAL HIGH (ref 6–20)
CO2: 31 mmol/L (ref 22–32)
Calcium: 8.5 mg/dL — ABNORMAL LOW (ref 8.9–10.3)
Chloride: 107 mmol/L (ref 101–111)
Creatinine, Ser: 2.59 mg/dL — ABNORMAL HIGH (ref 0.61–1.24)
GFR calc Af Amer: 24 mL/min — ABNORMAL LOW (ref >60)
GFR calc non Af Amer: 21 mL/min — ABNORMAL LOW (ref >60)
Glucose, Bld: 146 mg/dL — ABNORMAL HIGH (ref 65–99)
Phosphorus: 2.9 mg/dL (ref 2.5–4.6)
Potassium: 3.5 mmol/L (ref 3.5–5.1)
Sodium: 147 mmol/L — ABNORMAL HIGH (ref 135–145)

## 2016-09-22 LAB — MAGNESIUM: Magnesium: 2 mg/dL (ref 1.7–2.4)

## 2016-09-22 MED ORDER — FENTANYL CITRATE (PF) 100 MCG/2ML IJ SOLN
INTRAMUSCULAR | Status: AC
  Filled 2016-09-22: qty 2

## 2016-09-22 MED ORDER — FENTANYL CITRATE (PF) 100 MCG/2ML IJ SOLN
INTRAMUSCULAR | Status: AC | PRN
  Administered 2016-09-22: 50 ug via INTRAVENOUS

## 2016-09-22 MED ORDER — MIDAZOLAM HCL 2 MG/2ML IJ SOLN
INTRAMUSCULAR | Status: AC | PRN
  Administered 2016-09-22: 1 mg via INTRAVENOUS

## 2016-09-22 MED ORDER — MIDAZOLAM HCL 2 MG/2ML IJ SOLN
INTRAMUSCULAR | Status: AC
  Filled 2016-09-22: qty 2

## 2016-09-22 MED ORDER — GLUCAGON HCL RDNA (DIAGNOSTIC) 1 MG IJ SOLR
INTRAMUSCULAR | Status: AC
  Filled 2016-09-22: qty 1

## 2016-09-22 MED ORDER — LIDOCAINE HCL (PF) 1 % IJ SOLN
INTRAMUSCULAR | Status: AC | PRN
  Administered 2016-09-22: 5 mL

## 2016-09-22 MED ORDER — IOPAMIDOL (ISOVUE-300) INJECTION 61%
INTRAVENOUS | Status: AC
  Administered 2016-09-22: 8 mL
  Filled 2016-09-22: qty 50

## 2016-09-22 MED ORDER — LIDOCAINE HCL (PF) 1 % IJ SOLN
INTRAMUSCULAR | Status: AC
  Filled 2016-09-22: qty 30

## 2016-09-22 MED ORDER — GLUCAGON HCL (RDNA) 1 MG IJ SOLR
INTRAMUSCULAR | Status: AC | PRN
  Administered 2016-09-22: 1 mg via INTRAVENOUS

## 2016-09-22 NOTE — Sedation Documentation (Signed)
Pt resting, no complaints at this time. Procedure started, RRT at bedside to manage ventilator

## 2016-09-22 NOTE — Procedures (Signed)
Interventional Radiology Procedure Note  Procedure: Placement of percutaneous 20F pull-through gastrostomy tube. Complications: None Recommendations: - NPO except for sips and chips remainder of today and overnight - Maintain G-tube to LWS until tomorrow morning  - May advance diet as tolerated and begin using tube tomorrow morning  Signed,  Evelene Roussin K. Bridgid Printz, MD   

## 2016-09-22 NOTE — Sedation Documentation (Signed)
Patient is resting comfortably.  Denies pain.

## 2016-09-23 LAB — BASIC METABOLIC PANEL
Anion gap: 9 (ref 5–15)
BUN: 75 mg/dL — AB (ref 6–20)
CALCIUM: 8.3 mg/dL — AB (ref 8.9–10.3)
CO2: 33 mmol/L — AB (ref 22–32)
CREATININE: 2.42 mg/dL — AB (ref 0.61–1.24)
Chloride: 107 mmol/L (ref 101–111)
GFR calc non Af Amer: 23 mL/min — ABNORMAL LOW (ref >60)
GFR, EST AFRICAN AMERICAN: 26 mL/min — AB (ref >60)
GLUCOSE: 125 mg/dL — AB (ref 65–99)
Potassium: 3.6 mmol/L (ref 3.5–5.1)
Sodium: 149 mmol/L — ABNORMAL HIGH (ref 135–145)

## 2016-09-23 NOTE — Progress Notes (Signed)
Patient ID: Sean Arellano, male   DOB: 08/17/1929, 83 y.o.   MRN: [id number]    Referring Physician(s): Dr. Sharyon Medicus  Supervising Physician: Richarda Overlie  Patient Status: [facility] - inpt  Chief Complaint: Dysphagia, respiratory failure  Subjective: Patient on vent.  Denies abdominal pain.  Allergies: Patient has no known allergies.  Medications: Prior to Admission medications   Medication Sig Start Date End Date Taking? Authorizing Provider  amLODipine (NORVASC) 10 MG tablet Take 1 tablet by mouth daily. 08/11/15   [provider]  aspirin 81 MG tablet Take 81 mg by mouth daily.    [provider]  cloNIDine (CATAPRES) 0.1 MG tablet Take 1 tablet (0.1 mg total) by mouth 3 (three) times daily. 08/29/15   Simonne Martinet, NP  fenofibrate (TRICOR) 145 MG tablet Take 1 tablet by mouth daily. 08/09/15   [provider]  hydrALAZINE (APRESOLINE) 10 MG tablet Take 1 tablet (10 mg total) by mouth 3 (three) times daily. 08/29/15   Simonne Martinet, NP  nicotine (NICODERM CQ - DOSED IN MG/24 HR) 7 mg/24hr patch Place 1 patch (7 mg total) onto the skin daily. 08/29/15   Simonne Martinet, NP  predniSONE (DELTASONE) 10 MG tablet Take 4 tabs  daily with food x 4 days, then 3 tabs daily x 4 days, then 2 tabs daily x 4 days, then 1 tab daily x4 days then stop. #40 08/29/15   Simonne Martinet, NP  SYMBICORT 160-4.5 MCG/ACT inhaler Inhale 2 puffs into the lungs daily. 07/12/15   [provider]  tiotropium (SPIRIVA) 18 MCG inhalation capsule Place 1 capsule (18 mcg total) into inhaler and inhale daily. 08/29/15   Simonne Martinet, NP    Vital Signs: BP 123/62   Pulse 96   Resp 19   SpO2 97%   Physical Exam: Abd: soft, g-tube in place with no evidence of infection or erythema.  NT, ND  Imaging: Ct Abdomen Wo Contrast  Result Date: 09/20/2016 CLINICAL DATA:  Malnutrition and failure to thrive. Evaluation of anatomy prior to possible percutaneous gastrostomy tube placement.  EXAM: CT ABDOMEN WITHOUT CONTRAST TECHNIQUE: Multidetector CT imaging of the abdomen was performed following the standard protocol without IV contrast. COMPARISON:  None. FINDINGS: Lower chest: Lung bases show small bilateral pleural effusions with bibasilar atelectasis. Hepatobiliary: No focal liver abnormality is seen. Status post cholecystectomy. No biliary dilatation. Pancreas: Unremarkable. No pancreatic ductal dilatation or surrounding inflammatory changes. Spleen: Normal in size without focal abnormality. Adrenals/Urinary Tract: Adrenal glands are unremarkable. Kidneys are normal, without renal calculi, focal lesion, or hydronephrosis. Stomach/Bowel: Nasogastric tube is present that extends into the body of the stomach. The stomach is normally positioned and is located superior to the transverse colon. No evidence of bowel obstruction, colonic dilatation, free air or abnormal fluid collection. Vascular/Lymphatic: No enlarged lymph nodes identified. The abdominal aorta is calcified without evidence of aneurysmal dilatation. Other: No ascites.  Mild body wall anasarca present. Musculoskeletal: Degenerative disc disease is present in the visualized lumbar spine. IMPRESSION: Normal gastric anatomy and positioning with no contraindication to percutaneous gastrostomy. Lung bases demonstrate bilateral pleural effusions with bibasilar atelectasis. Nasogastric tube extends into the body of the stomach. Electronically Signed   By: Irish Lack M.D.   On: 09/20/2016 22:50   Ir Gastrostomy Tube Mod Sed  Result Date: 09/22/2016 INDICATION: 83 year old male with protein calorie malnutrition in need of percutaneous gastrostomy tube for nutrition. EXAM: Fluoroscopically guided placement of percutaneous pull-through gastrostomy tube Interventional Radiologist:  Sterling BigHeath K. McCullough, MD MEDICATIONS: In patient currently receiving intravenous Rocephin. No additional prophylaxis was administered. 1 mg glucagon was  administered intravenously. ANESTHESIA/SEDATION: Versed 1 mg IV; Fentanyl 50 mcg IV Moderate Sedation Time:  10 minutes The patient was continuously monitored during the procedure by the interventional radiology nurse under my direct supervision. CONTRAST:  8 mL Isovue-300 FLUOROSCOPY TIME:  Fluoroscopy Time: 2 minutes 12 seconds (99 mGy). COMPLICATIONS: None immediate. PROCEDURE: Informed written consent was obtained from the patient after a thorough discussion of the procedural risks, benefits and alternatives. All questions were addressed. Maximal Sterile Barrier Technique was utilized including caps, mask, sterile gowns, sterile gloves, sterile drape, hand hygiene and skin antiseptic. A timeout was performed prior to the initiation of the procedure. Maximal barrier sterile technique utilized including caps, mask, sterile gowns, sterile gloves, large sterile drape, hand hygiene, and chlorhexadine skin prep. An angled catheter was advanced over a wire under fluoroscopic guidance through the nose, down the esophagus and into the body of the stomach. The stomach was then insufflated with several 100 ml of air. Fluoroscopy confirmed location of the gastric bubble, as well as inferior displacement of the barium stained colon. Under direct fluoroscopic guidance, a single T-tack was placed, and the anterior gastric wall drawn up against the anterior abdominal wall. Percutaneous access was then obtained into the mid gastric body with an 18 gauge sheath needle. Aspiration of air, and injection of contrast material under fluoroscopy confirmed needle placement. An Amplatz wire was advanced in the gastric body and the access needle exchanged for a 9-French vascular sheath. A snare device was advanced through the vascular sheath and an Amplatz wire advanced through the angled catheter. The Amplatz wire was successfully snared and this was pulled up through the esophagus and out the mouth. A 20-French Burnell BlanksKimberly Clark MIC-PEG  tube was then connected to the snare and pulled through the mouth, down the esophagus, into the stomach and out to the anterior abdominal wall. Hand injection of contrast material confirmed intragastric location. The T-tack retention suture was then cut. The pull through peg tube was then secured with the external bumper and capped. The patient will be observed for several hours with the newly placed tube on low wall suction to evaluate for any post procedure complication. The patient tolerated the procedure well, there is no immediate complication. IMPRESSION: Successful placement of a 20 French pull through gastrostomy tube. Electronically Signed   By: Malachy MoanHeath  McCullough M.D.   On: 09/22/2016 14:03   Dg Chest Port 1 View  Result Date: 09/21/2016 CLINICAL DATA:  Respiratory failure.  COPD. EXAM: PORTABLE CHEST 1 VIEW COMPARISON:  09/15/2016 FINDINGS: Tracheostomy tube is now in place which has replaced the endotracheal tube. Tip is 7.6 cm from the carina. NG tube is stable beyond the gastroesophageal junction. Stable right upper extremity PICC with its tip in the lower SVC. Bilateral pleural effusions, and bilateral central basilar airspace disease are stable. Heart is normal in size. No pneumothorax. IMPRESSION: Tracheostomy tube in place. Bilateral airspace disease and bilateral pleural effusions are not significantly changed. Electronically Signed   By: Jolaine ClickArthur  Hoss M.D.   On: 09/21/2016 07:57    Labs:  CBC:  Recent Labs  09/16/16 0415 09/17/16 0542 09/21/16 0642 09/22/16 0538  WBC 6.4 6.3 7.6 8.1  HGB 9.5* 8.6* 8.2* 7.7*  HCT 29.9* 27.3* 26.2* 24.5*  PLT 108* 106* 69* 63*    COAGS:  Recent Labs  09/21/16 0642  INR 1.14  APTT 27  BMP:  Recent Labs  09/17/16 0542 09/21/16 0642 09/22/16 1004 09/23/16 0541  NA 145 149* 147* 149*  K 3.8 3.6 3.5 3.6  CL 106 108 107 107  CO2 30 31 31  33*  GLUCOSE 106* 115* 146* 125*  BUN 77* 77* 74* 75*  CALCIUM 8.4* 8.6* 8.5* 8.3*    CREATININE 3.81* 2.77* 2.59* 2.42*  GFRNONAA 13* 19* 21* 23*  GFRAA 15* 22* 24* 26*    LIVER FUNCTION TESTS:  Recent Labs  09/01/16 0512  09/16/16 0415 09/17/16 0542 09/21/16 0642 09/22/16 1004  BILITOT 1.8*  --  1.0 1.0  --   --   AST 19  --  23 19  --   --   ALT 30  --  29 28  --   --   ALKPHOS 38  --  49 65  --   --   PROT 5.5*  --  5.0* 5.0*  --   --   ALBUMIN 3.3*  < > 2.3* 2.3* 2.3* 2.4*  < > = values in this interval not displayed.  Assessment and Plan: 1. Dysphagia, respiratory failure  g-tube in good position and looks good.  Ok to use for feedings 24 hrs after placement.  Please call if any further assistance is needed.  Electronically Signed: Letha Cape 09/23/2016, 9:32 AM   I spent a total of 15 Minutes at the the patient's bedside AND on the patient's hospital floor or unit, greater than 50% of which was counseling/coordinating care for dysphagia

## 2016-09-24 LAB — CBC
HCT: 23.9 % — ABNORMAL LOW (ref 39.0–52.0)
Hemoglobin: 7.3 g/dL — ABNORMAL LOW (ref 13.0–17.0)
MCH: 29.1 pg (ref 26.0–34.0)
MCHC: 30.5 g/dL (ref 30.0–36.0)
MCV: 95.2 fL (ref 78.0–100.0)
PLATELETS: 38 10*3/uL — AB (ref 150–400)
RBC: 2.51 MIL/uL — AB (ref 4.22–5.81)
RDW: 16.9 % — ABNORMAL HIGH (ref 11.5–15.5)
WBC: 5.8 10*3/uL (ref 4.0–10.5)

## 2016-09-24 LAB — RENAL FUNCTION PANEL
Albumin: 2.3 g/dL — ABNORMAL LOW (ref 3.5–5.0)
Anion gap: 9 (ref 5–15)
BUN: 78 mg/dL — ABNORMAL HIGH (ref 6–20)
CHLORIDE: 105 mmol/L (ref 101–111)
CO2: 33 mmol/L — ABNORMAL HIGH (ref 22–32)
CREATININE: 2.23 mg/dL — AB (ref 0.61–1.24)
Calcium: 8.7 mg/dL — ABNORMAL LOW (ref 8.9–10.3)
GFR, EST AFRICAN AMERICAN: 29 mL/min — AB (ref >60)
GFR, EST NON AFRICAN AMERICAN: 25 mL/min — AB (ref >60)
Glucose, Bld: 127 mg/dL — ABNORMAL HIGH (ref 65–99)
POTASSIUM: 3.6 mmol/L (ref 3.5–5.1)
Phosphorus: 3 mg/dL (ref 2.5–4.6)
Sodium: 147 mmol/L — ABNORMAL HIGH (ref 135–145)

## 2016-09-24 LAB — PREPARE RBC (CROSSMATCH)

## 2016-09-24 LAB — ABO/RH: ABO/RH(D): B POS

## 2016-09-24 NOTE — Progress Notes (Signed)
[facility]  ROUNDING NOTE   Subjective:   Trach placed. Patient able to answer yes and no questions.   Started on D5W for free water deficit  PRBC transfusion due to hemoglobin 7.3  Tube feeds decreased due to N/V  Objective:  Vital signs in last 24 hours:  T 95.7 p 90 bp 136/63   Physical Exam: General: No acute distress  Head: [place]/AT, dry mucosal membranes, NG tube  Eyes: Anicteric  Neck: +tracheostomy  Lungs:  Clear  Heart: S1S2 no rubs  Abdomen:  Soft, nontender, bowel sounds present  Extremities: No peripheral edema.  Neurologic: Awake, alert, following commands,   Skin: No lesions  GU: +urine in foley    Basic Metabolic Panel:  Recent Labs Lab 09/21/16 0642 09/22/16 0538 09/22/16 1004 09/23/16 0541 09/24/16 0507  NA 149*  --  147* 149* 147*  K 3.6  --  3.5 3.6 3.6  CL 108  --  107 107 105  CO2 31  --  31 33* 33*  GLUCOSE 115*  --  146* 125* 127*  BUN 77*  --  74* 75* 78*  CREATININE 2.77*  --  2.59* 2.42* 2.23*  CALCIUM 8.6*  --  8.5* 8.3* 8.7*  MG 2.2 2.0  --   --   --   PHOS 3.2  --  2.9  --  3.0    Liver Function Tests:  Recent Labs Lab 09/21/16 0642 09/22/16 1004 09/24/16 0507  ALBUMIN 2.3* 2.4* 2.3*   No results for input(s): LIPASE, AMYLASE in the last 168 hours. No results for input(s): AMMONIA in the last 168 hours.  CBC:  Recent Labs Lab 09/21/16 0642 09/22/16 0538 09/24/16 0507  WBC 7.6 8.1 5.8  HGB 8.2* 7.7* 7.3*  HCT 26.2* 24.5* 23.9*  MCV 93.6 92.5 95.2  PLT 69* 63* 38*    Cardiac Enzymes: No results for input(s): CKTOTAL, CKMB, CKMBINDEX, TROPONINI in the last 168 hours.  BNP: Invalid input(s): POCBNP  CBG: No results for input(s): GLUCAP in the last 168 hours.  Microbiology: Results for orders placed or performed during the hospital encounter of 08/31/16  Culture, Urine     Status: None   Collection Time: 09/06/16  1:02 PM  Result Value Ref Range Status   Specimen Description URINE, RANDOM   Final   Special Requests NONE  Final   Culture NO GROWTH  Final   Report Status 09/08/2016 FINAL  Final  Culture, respiratory (NON-Expectorated)     Status: None   Collection Time: 09/08/16  4:19 PM  Result Value Ref Range Status   Specimen Description TRACHEAL ASPIRATE  Final   Special Requests NONE  Final   Gram Stain   Final    ABUNDANT WBC PRESENT,BOTH PMN AND MONONUCLEAR NO ORGANISMS SEEN    Culture   Final    RARE FUNGUS (MOLD) ISOLATED, PROBABLE CONTAMINANT/COLONIZER (SAPROPHYTE). CONTACT MICROBIOLOGY IF FURTHER IDENTIFICATION REQUIRED [phone].   Report Status 09/11/2016 FINAL  Final  C difficile quick scan w PCR reflex     Status: None   Collection Time: 09/09/16  8:20 AM  Result Value Ref Range Status   C Diff antigen NEGATIVE NEGATIVE Final   C Diff toxin NEGATIVE NEGATIVE Final   C Diff interpretation No C. difficile detected.  Final  Culture, blood (routine x 2)     Status: None   Collection Time: 09/10/16 11:18 AM  Result Value Ref Range Status   Specimen Description BLOOD RIGHT HAND  Final  Special Requests IN PEDIATRIC BOTTLE Blood Culture adequate volume  Final   Culture NO GROWTH 5 DAYS  Final   Report Status 09/15/2016 FINAL  Final  Culture, blood (routine x 2)     Status: None   Collection Time: 09/10/16 11:18 AM  Result Value Ref Range Status   Specimen Description BLOOD RIGHT HAND  Final   Special Requests IN PEDIATRIC BOTTLE Blood Culture adequate volume  Final   Culture NO GROWTH 5 DAYS  Final   Report Status 09/15/2016 FINAL  Final    Coagulation Studies: No results for input(s): LABPROT, INR in the last 72 hours.  Urinalysis: No results for input(s): COLORURINE, LABSPEC, PHURINE, GLUCOSEU, HGBUR, BILIRUBINUR, KETONESUR, PROTEINUR, UROBILINOGEN, NITRITE, LEUKOCYTESUR in the last 72 hours.  Invalid input(s): APPERANCEUR    Imaging: No results found.   Medications:       Assessment/ Plan:  83 y.o. black male with a PMHx of  Obstructive sleep apnea, recent cardiac arrest, COPD, who was admitted to [facility] on 08/31/2016 for ongoing treatment of respiratory failure. He was admitted to the referring hospital on 08/19/2016 for acute on chronic hypercarbic respiratory failure secondary to COPD exacerbation. He also was felt to have community acquired pneumonia at that time. Unfortunately the patient deteriorated and suffered cardiopulmonary arrest secondary to torsades and ventricular fibrillation requiring CPR.  We are asked to see the patient for evaluation management of acute renal failure which is most likely secondary to vancomycin toxicity. Patient was also receiving diuretics and is on lisinopril.  1. Acute renal failure: secondary to vancomycin toxicity, ACE inhibitor use, and dehydration secondary to diuretics. Nonoliguric urine outpatient. Creatinine continues to improve  2. Hypertension: better controlled - amlodipine, imdur, and metoprolol  3. Anemia with renal failure: hemoglobin 7.3 - PRBC transfusion for today 8/10 - consider aranesp  4. Acute Respiratory Failure: on mechanical ventilation.  - now with tracheostomy on spontaneous ventilation.   5. Hypernatremia: Na 147 - free water deficit of 2.7 liters - Continue D5W at 62mL/hr - Continue free water flushes with tube feeds.    LOS: 0 Charlane Westry 8/10/20183:18 PM

## 2016-09-25 ENCOUNTER — Other Ambulatory Visit (HOSPITAL_COMMUNITY): Payer: Medicare Other

## 2016-09-25 LAB — RENAL FUNCTION PANEL
Albumin: 2.4 g/dL — ABNORMAL LOW (ref 3.5–5.0)
Anion gap: 15 (ref 5–15)
BUN: 76 mg/dL — AB (ref 6–20)
CALCIUM: 8.6 mg/dL — AB (ref 8.9–10.3)
CO2: 28 mmol/L (ref 22–32)
CREATININE: 2.22 mg/dL — AB (ref 0.61–1.24)
Chloride: 105 mmol/L (ref 101–111)
GFR, EST AFRICAN AMERICAN: 29 mL/min — AB (ref >60)
GFR, EST NON AFRICAN AMERICAN: 25 mL/min — AB (ref >60)
Glucose, Bld: 121 mg/dL — ABNORMAL HIGH (ref 65–99)
Phosphorus: 3.1 mg/dL (ref 2.5–4.6)
Potassium: 3.5 mmol/L (ref 3.5–5.1)
SODIUM: 148 mmol/L — AB (ref 135–145)

## 2016-09-25 LAB — BPAM RBC
Blood Product Expiration Date: 201808132359
ISSUE DATE / TIME: 201808101248
UNIT TYPE AND RH: 9500

## 2016-09-25 LAB — TYPE AND SCREEN
ABO/RH(D): B POS
ANTIBODY SCREEN: NEGATIVE
Unit division: 0

## 2016-09-25 LAB — CBC
HCT: 29 % — ABNORMAL LOW (ref 39.0–52.0)
Hemoglobin: 9.1 g/dL — ABNORMAL LOW (ref 13.0–17.0)
MCH: 29.7 pg (ref 26.0–34.0)
MCHC: 31.4 g/dL (ref 30.0–36.0)
MCV: 94.8 fL (ref 78.0–100.0)
PLATELETS: 36 10*3/uL — AB (ref 150–400)
RBC: 3.06 MIL/uL — ABNORMAL LOW (ref 4.22–5.81)
RDW: 17.4 % — AB (ref 11.5–15.5)
WBC: 5.6 10*3/uL (ref 4.0–10.5)

## 2016-09-25 LAB — MAGNESIUM: MAGNESIUM: 1.9 mg/dL (ref 1.7–2.4)

## 2016-09-26 ENCOUNTER — Other Ambulatory Visit (HOSPITAL_COMMUNITY): Payer: Medicare Other

## 2016-09-26 LAB — RENAL FUNCTION PANEL
ALBUMIN: 2.2 g/dL — AB (ref 3.5–5.0)
Anion gap: 7 (ref 5–15)
BUN: 75 mg/dL — AB (ref 6–20)
CALCIUM: 8.5 mg/dL — AB (ref 8.9–10.3)
CO2: 35 mmol/L — AB (ref 22–32)
CREATININE: 2.12 mg/dL — AB (ref 0.61–1.24)
Chloride: 105 mmol/L (ref 101–111)
GFR calc Af Amer: 31 mL/min — ABNORMAL LOW (ref >60)
GFR calc non Af Amer: 27 mL/min — ABNORMAL LOW (ref >60)
GLUCOSE: 116 mg/dL — AB (ref 65–99)
Phosphorus: 2.7 mg/dL (ref 2.5–4.6)
Potassium: 3.4 mmol/L — ABNORMAL LOW (ref 3.5–5.1)
SODIUM: 147 mmol/L — AB (ref 135–145)

## 2016-09-26 LAB — CBC
HCT: 24.5 % — ABNORMAL LOW (ref 39.0–52.0)
Hemoglobin: 7.6 g/dL — ABNORMAL LOW (ref 13.0–17.0)
MCH: 29 pg (ref 26.0–34.0)
MCHC: 31 g/dL (ref 30.0–36.0)
MCV: 93.5 fL (ref 78.0–100.0)
PLATELETS: 46 10*3/uL — AB (ref 150–400)
RBC: 2.62 MIL/uL — AB (ref 4.22–5.81)
RDW: 16.3 % — ABNORMAL HIGH (ref 11.5–15.5)
WBC: 5.3 10*3/uL (ref 4.0–10.5)

## 2016-09-26 LAB — MAGNESIUM: Magnesium: 1.9 mg/dL (ref 1.7–2.4)

## 2016-09-26 LAB — HEPARIN INDUCED PLATELET AB (HIT ANTIBODY): HEPARIN INDUCED PLT AB: 0.188 {OD_unit} (ref 0.000–0.400)

## 2016-09-27 LAB — CBC
HCT: 24.6 % — ABNORMAL LOW (ref 39.0–52.0)
Hemoglobin: 7.5 g/dL — ABNORMAL LOW (ref 13.0–17.0)
MCH: 28.7 pg (ref 26.0–34.0)
MCHC: 30.5 g/dL (ref 30.0–36.0)
MCV: 94.3 fL (ref 78.0–100.0)
PLATELETS: 67 10*3/uL — AB (ref 150–400)
RBC: 2.61 MIL/uL — AB (ref 4.22–5.81)
RDW: 16.4 % — ABNORMAL HIGH (ref 11.5–15.5)
WBC: 6.3 10*3/uL (ref 4.0–10.5)

## 2016-09-27 LAB — BLOOD GAS, ARTERIAL
Acid-Base Excess: 10.3 mmol/L — ABNORMAL HIGH (ref 0.0–2.0)
Acid-Base Excess: 10 mmol/L — ABNORMAL HIGH (ref 0.0–2.0)
BICARBONATE: 35.3 mmol/L — AB (ref 20.0–28.0)
Bicarbonate: 36.5 mmol/L — ABNORMAL HIGH (ref 20.0–28.0)
FIO2: 60
FIO2: 55
MECHVT: 500 mL
MECHVT: 500 mL
O2 Saturation: 93.8 %
O2 Saturation: 94 %
PATIENT TEMPERATURE: 98.6
PCO2 ART: 60.9 mmHg — AB (ref 32.0–48.0)
PEEP: 8 cmH2O
PO2 ART: 74.4 mmHg — AB (ref 83.0–108.0)
PO2 ART: 68 mmHg — AB (ref 83.0–108.0)
Patient temperature: 98.6
RATE: 15 resp/min
RATE: 18 resp/min
pCO2 arterial: 72.8 mmHg (ref 32.0–48.0)
pH, Arterial: 7.32 — ABNORMAL LOW (ref 7.350–7.450)
pH, Arterial: 7.381 (ref 7.350–7.450)

## 2016-09-27 LAB — RENAL FUNCTION PANEL
ALBUMIN: 2.2 g/dL — AB (ref 3.5–5.0)
Anion gap: 8 (ref 5–15)
BUN: 74 mg/dL — ABNORMAL HIGH (ref 6–20)
CALCIUM: 8.3 mg/dL — AB (ref 8.9–10.3)
CO2: 33 mmol/L — AB (ref 22–32)
CREATININE: 2.1 mg/dL — AB (ref 0.61–1.24)
Chloride: 103 mmol/L (ref 101–111)
GFR, EST AFRICAN AMERICAN: 31 mL/min — AB (ref >60)
GFR, EST NON AFRICAN AMERICAN: 27 mL/min — AB (ref >60)
Glucose, Bld: 129 mg/dL — ABNORMAL HIGH (ref 65–99)
Phosphorus: 2.8 mg/dL (ref 2.5–4.6)
Potassium: 4.3 mmol/L (ref 3.5–5.1)
SODIUM: 144 mmol/L (ref 135–145)

## 2016-09-27 LAB — MAGNESIUM: Magnesium: 1.9 mg/dL (ref 1.7–2.4)

## 2016-09-27 NOTE — Progress Notes (Signed)
[facility]  ROUNDING NOTE   Subjective:   Patient sitting up in chair Trach/vent I place Able to follow commands     Objective:  Vital signs in last 24 hours:  T 96.4   P 88  RR 30  BP 109/64  Vent fio2 55%, peep 8  Physical Exam: General: No acute distress  Head: [place]/AT,   Eyes: Anicteric  Neck: +tracheostomy  Lungs:  Clear  Heart: S1S2 no rubs. Irregular rhythm  Abdomen:  Soft, nontender, PEG in place  Extremities: + dependent peripheral edema.  Neurologic: Awake, alert, following commands,   Skin: No acute lesions  GU: +urine in foley    Basic Metabolic Panel:  Recent Labs Lab 09/21/16 0642 09/22/16 0538 09/22/16 1004 09/23/16 0541 09/24/16 0507 09/25/16 0626 09/25/16 0637 09/26/16 0449 09/27/16 0526  NA 149*  --  147* 149* 147*  --  148* 147* 144  K 3.6  --  3.5 3.6 3.6  --  3.5 3.4* 4.3  CL 108  --  107 107 105  --  105 105 103  CO2 31  --  31 33* 33*  --  28 35* 33*  GLUCOSE 115*  --  146* 125* 127*  --  121* 116* 129*  BUN 77*  --  74* 75* 78*  --  76* 75* 74*  CREATININE 2.77*  --  2.59* 2.42* 2.23*  --  2.22* 2.12* 2.10*  CALCIUM 8.6*  --  8.5* 8.3* 8.7*  --  8.6* 8.5* 8.3*  MG 2.2 2.0  --   --   --  1.9  --  1.9 1.9  PHOS 3.2  --  2.9  --  3.0  --  3.1 2.7 2.8    Liver Function Tests:  Recent Labs Lab 09/22/16 1004 09/24/16 0507 09/25/16 0637 09/26/16 0449 09/27/16 0526  ALBUMIN 2.4* 2.3* 2.4* 2.2* 2.2*   No results for input(s): LIPASE, AMYLASE in the last 168 hours. No results for input(s): AMMONIA in the last 168 hours.  CBC:  Recent Labs Lab 09/22/16 0538 09/24/16 0507 09/25/16 0626 09/26/16 0449 09/27/16 0644  WBC 8.1 5.8 5.6 5.3 6.3  HGB 7.7* 7.3* 9.1* 7.6* 7.5*  HCT 24.5* 23.9* 29.0* 24.5* 24.6*  MCV 92.5 95.2 94.8 93.5 94.3  PLT 63* 38* 36* 46* 67*    Cardiac Enzymes: No results for input(s): CKTOTAL, CKMB, CKMBINDEX, TROPONINI in the last 168 hours.  BNP: Invalid input(s): POCBNP  CBG: No  results for input(s): GLUCAP in the last 168 hours.  Microbiology: Results for orders placed or performed during the hospital encounter of 08/31/16  Culture, Urine     Status: None   Collection Time: 09/06/16  1:02 PM  Result Value Ref Range Status   Specimen Description URINE, RANDOM  Final   Special Requests NONE  Final   Culture NO GROWTH  Final   Report Status 09/08/2016 FINAL  Final  Culture, respiratory (NON-Expectorated)     Status: None   Collection Time: 09/08/16  4:19 PM  Result Value Ref Range Status   Specimen Description TRACHEAL ASPIRATE  Final   Special Requests NONE  Final   Gram Stain   Final    ABUNDANT WBC PRESENT,BOTH PMN AND MONONUCLEAR NO ORGANISMS SEEN    Culture   Final    RARE FUNGUS (MOLD) ISOLATED, PROBABLE CONTAMINANT/COLONIZER (SAPROPHYTE). CONTACT MICROBIOLOGY IF FURTHER IDENTIFICATION REQUIRED [phone].   Report Status 09/11/2016 FINAL  Final  C difficile quick scan w PCR reflex  Status: None   Collection Time: 09/09/16  8:20 AM  Result Value Ref Range Status   C Diff antigen NEGATIVE NEGATIVE Final   C Diff toxin NEGATIVE NEGATIVE Final   C Diff interpretation No C. difficile detected.  Final  Culture, blood (routine x 2)     Status: None   Collection Time: 09/10/16 11:18 AM  Result Value Ref Range Status   Specimen Description BLOOD RIGHT HAND  Final   Special Requests IN PEDIATRIC BOTTLE Blood Culture adequate volume  Final   Culture NO GROWTH 5 DAYS  Final   Report Status 09/15/2016 FINAL  Final  Culture, blood (routine x 2)     Status: None   Collection Time: 09/10/16 11:18 AM  Result Value Ref Range Status   Specimen Description BLOOD RIGHT HAND  Final   Special Requests IN PEDIATRIC BOTTLE Blood Culture adequate volume  Final   Culture NO GROWTH 5 DAYS  Final   Report Status 09/15/2016 FINAL  Final    Coagulation Studies: No results for input(s): LABPROT, INR in the last 72 hours.  Urinalysis: No results for input(s):  COLORURINE, LABSPEC, PHURINE, GLUCOSEU, HGBUR, BILIRUBINUR, KETONESUR, PROTEINUR, UROBILINOGEN, NITRITE, LEUKOCYTESUR in the last 72 hours.  Invalid input(s): APPERANCEUR    Imaging: Dg Chest Port 1 View  Result Date: 09/26/2016 CLINICAL DATA:  Respiratory failure, tracheostomy, COPD, hypertension, former smoker EXAM: PORTABLE CHEST 1 VIEW COMPARISON:  Portable exam 0543 hours compared 09/25/2016 FINDINGS: Tip of tracheostomy tube projects 8.6 cm above carina. Tip of RIGHT arm PICC line projects over SVC. Enlargement of cardiac silhouette. BILATERAL pulmonary infiltrates at the mid to lower lungs unchanged. Upper lungs clear. No pneumothorax. Bones demineralized. Incidentally noted atherosclerotic calcification aorta. IMPRESSION: Persistent BILATERAL pulmonary infiltrates at the mid to lower lungs. Electronically Signed   By: Ulyses Southward M.D.   On: 09/26/2016 07:36     Medications:       Assessment/ Plan:  83 y.o. black male with a PMHx of Obstructive sleep apnea, recent cardiac arrest, COPD, who was admitted to [facility] on 08/31/2016 for ongoing treatment of respiratory failure. He was admitted to the referring hospital on 08/19/2016 for acute on chronic hypercarbic respiratory failure secondary to COPD exacerbation. He also was felt to have community acquired pneumonia at that time. Unfortunately the patient deteriorated and suffered cardiopulmonary arrest secondary to torsades and ventricular fibrillation requiring CPR.  We are asked to see the patient for evaluation management of acute renal failure which is most likely secondary to vancomycin toxicity. Patient was also receiving diuretics and is on lisinopril.  1. Acute renal failure: secondary to vancomycin toxicity, ACE inhibitor use, and dehydration secondary to diuretics. Nonoliguric urine outpatient. Creatinine appears to be stabilizing  2. Hypertension: better controlled -  imdur, and metoprolol - d/c amlodipine - start  Lasix  3. Anemia with renal failure: hemoglobin 7.5 - PRBC transfusion this admission  4. Acute Respiratory Failure: on mechanical ventilation.   5. Hypernatremia:Na 144, improved   LOS: 0 Ercole Georg 8/13/20184:45 PM

## 2016-09-28 LAB — CBC
HEMATOCRIT: 24.4 % — AB (ref 39.0–52.0)
Hemoglobin: 7.5 g/dL — ABNORMAL LOW (ref 13.0–17.0)
MCH: 29.2 pg (ref 26.0–34.0)
MCHC: 30.7 g/dL (ref 30.0–36.0)
MCV: 94.9 fL (ref 78.0–100.0)
Platelets: 88 10*3/uL — ABNORMAL LOW (ref 150–400)
RBC: 2.57 MIL/uL — ABNORMAL LOW (ref 4.22–5.81)
RDW: 16.6 % — AB (ref 11.5–15.5)
WBC: 7.1 10*3/uL (ref 4.0–10.5)

## 2016-09-28 LAB — BASIC METABOLIC PANEL
Anion gap: 8 (ref 5–15)
BUN: 78 mg/dL — ABNORMAL HIGH (ref 6–20)
CO2: 33 mmol/L — AB (ref 22–32)
Calcium: 8.3 mg/dL — ABNORMAL LOW (ref 8.9–10.3)
Chloride: 104 mmol/L (ref 101–111)
Creatinine, Ser: 2.25 mg/dL — ABNORMAL HIGH (ref 0.61–1.24)
GFR calc Af Amer: 29 mL/min — ABNORMAL LOW (ref >60)
GFR calc non Af Amer: 25 mL/min — ABNORMAL LOW (ref >60)
GLUCOSE: 124 mg/dL — AB (ref 65–99)
Potassium: 4.8 mmol/L (ref 3.5–5.1)
Sodium: 145 mmol/L (ref 135–145)

## 2016-09-28 LAB — OCCULT BLOOD X 1 CARD TO LAB, STOOL: Fecal Occult Bld: NEGATIVE

## 2016-09-29 LAB — CBC
HCT: 23.6 % — ABNORMAL LOW (ref 39.0–52.0)
HEMOGLOBIN: 7.2 g/dL — AB (ref 13.0–17.0)
MCH: 28.9 pg (ref 26.0–34.0)
MCHC: 30.5 g/dL (ref 30.0–36.0)
MCV: 94.8 fL (ref 78.0–100.0)
Platelets: 132 10*3/uL — ABNORMAL LOW (ref 150–400)
RBC: 2.49 MIL/uL — AB (ref 4.22–5.81)
RDW: 16.9 % — ABNORMAL HIGH (ref 11.5–15.5)
WBC: 6.8 10*3/uL (ref 4.0–10.5)

## 2016-09-29 LAB — BASIC METABOLIC PANEL
Anion gap: 9 (ref 5–15)
BUN: 74 mg/dL — ABNORMAL HIGH (ref 6–20)
CHLORIDE: 101 mmol/L (ref 101–111)
CO2: 35 mmol/L — ABNORMAL HIGH (ref 22–32)
Calcium: 8.7 mg/dL — ABNORMAL LOW (ref 8.9–10.3)
Creatinine, Ser: 2.1 mg/dL — ABNORMAL HIGH (ref 0.61–1.24)
GFR calc non Af Amer: 27 mL/min — ABNORMAL LOW (ref >60)
GFR, EST AFRICAN AMERICAN: 31 mL/min — AB (ref >60)
Glucose, Bld: 144 mg/dL — ABNORMAL HIGH (ref 65–99)
POTASSIUM: 3.6 mmol/L (ref 3.5–5.1)
SODIUM: 145 mmol/L (ref 135–145)

## 2016-09-29 NOTE — Progress Notes (Signed)
[facility]  ROUNDING NOTE   Subjective:   Patient  Laying in the bed Trach/vent in place Able to follow commands     Objective:  Vital signs in last 24 hours:  Temperature 97.7, pulse 93, respirations 21, blood pressure 157/87  Vent fio2 50%, peep 8  Physical Exam: General: No acute distress  Head: [place]/AT,   Eyes: Anicteric  Neck: +tracheostomy  Lungs:  Ventilator assisted, coarse breath sounds bilaterally   Heart: S1S2 no rubs. Irregular rhythm  Abdomen:  Soft, nontender, PEG in place  Extremities: + dependent peripheral edema.  Neurologic: Awake, alert, following commands,   Skin: No acute lesions  GU:  foley    Basic Metabolic Panel:  Recent Labs Lab 09/24/16 0507 09/25/16 0626 09/25/16 0637 09/26/16 0449 09/27/16 0526 09/28/16 0507 09/29/16 0709  NA 147*  --  148* 147* 144 145 145  K 3.6  --  3.5 3.4* 4.3 4.8 3.6  CL 105  --  105 105 103 104 101  CO2 33*  --  28 35* 33* 33* 35*  GLUCOSE 127*  --  121* 116* 129* 124* 144*  BUN 78*  --  76* 75* 74* 78* 74*  CREATININE 2.23*  --  2.22* 2.12* 2.10* 2.25* 2.10*  CALCIUM 8.7*  --  8.6* 8.5* 8.3* 8.3* 8.7*  MG  --  1.9  --  1.9 1.9  --   --   PHOS 3.0  --  3.1 2.7 2.8  --   --     Liver Function Tests:  Recent Labs Lab 09/24/16 0507 09/25/16 0637 09/26/16 0449 09/27/16 0526  ALBUMIN 2.3* 2.4* 2.2* 2.2*   No results for input(s): LIPASE, AMYLASE in the last 168 hours. No results for input(s): AMMONIA in the last 168 hours.  CBC:  Recent Labs Lab 09/25/16 0626 09/26/16 0449 09/27/16 0644 09/28/16 0721 09/29/16 0709  WBC 5.6 5.3 6.3 7.1 6.8  HGB 9.1* 7.6* 7.5* 7.5* 7.2*  HCT 29.0* 24.5* 24.6* 24.4* 23.6*  MCV 94.8 93.5 94.3 94.9 94.8  PLT 36* 46* 67* 88* 132*    Cardiac Enzymes: No results for input(s): CKTOTAL, CKMB, CKMBINDEX, TROPONINI in the last 168 hours.  BNP: Invalid input(s): POCBNP  CBG: No results for input(s): GLUCAP in the last 168  hours.  Microbiology: Results for orders placed or performed during the hospital encounter of 08/31/16  Culture, Urine     Status: None   Collection Time: 09/06/16  1:02 PM  Result Value Ref Range Status   Specimen Description URINE, RANDOM  Final   Special Requests NONE  Final   Culture NO GROWTH  Final   Report Status 09/08/2016 FINAL  Final  Culture, respiratory (NON-Expectorated)     Status: None   Collection Time: 09/08/16  4:19 PM  Result Value Ref Range Status   Specimen Description TRACHEAL ASPIRATE  Final   Special Requests NONE  Final   Gram Stain   Final    ABUNDANT WBC PRESENT,BOTH PMN AND MONONUCLEAR NO ORGANISMS SEEN    Culture   Final    RARE FUNGUS (MOLD) ISOLATED, PROBABLE CONTAMINANT/COLONIZER (SAPROPHYTE). CONTACT MICROBIOLOGY IF FURTHER IDENTIFICATION REQUIRED [phone].   Report Status 09/11/2016 FINAL  Final  C difficile quick scan w PCR reflex     Status: None   Collection Time: 09/09/16  8:20 AM  Result Value Ref Range Status   C Diff antigen NEGATIVE NEGATIVE Final   C Diff toxin NEGATIVE NEGATIVE Final   C Diff interpretation  No C. difficile detected.  Final  Culture, blood (routine x 2)     Status: None   Collection Time: 09/10/16 11:18 AM  Result Value Ref Range Status   Specimen Description BLOOD RIGHT HAND  Final   Special Requests IN PEDIATRIC BOTTLE Blood Culture adequate volume  Final   Culture NO GROWTH 5 DAYS  Final   Report Status 09/15/2016 FINAL  Final  Culture, blood (routine x 2)     Status: None   Collection Time: 09/10/16 11:18 AM  Result Value Ref Range Status   Specimen Description BLOOD RIGHT HAND  Final   Special Requests IN PEDIATRIC BOTTLE Blood Culture adequate volume  Final   Culture NO GROWTH 5 DAYS  Final   Report Status 09/15/2016 FINAL  Final    Coagulation Studies: No results for input(s): LABPROT, INR in the last 72 hours.  Urinalysis: No results for input(s): COLORURINE, LABSPEC, PHURINE, GLUCOSEU, HGBUR,  BILIRUBINUR, KETONESUR, PROTEINUR, UROBILINOGEN, NITRITE, LEUKOCYTESUR in the last 72 hours.  Invalid input(s): APPERANCEUR    Imaging: No results found.   Medications:       Assessment/ Plan:  83 y.o. black male with a PMHx of Obstructive sleep apnea, recent cardiac arrest, COPD, who was admitted to [facility] on 08/31/2016 for ongoing treatment of respiratory failure. He was admitted to the referring hospital on 08/19/2016 for acute on chronic hypercarbic respiratory failure secondary to COPD exacerbation. He also was felt to have community acquired pneumonia at that time. Unfortunately the patient deteriorated and suffered cardiopulmonary arrest secondary to torsades and ventricular fibrillation requiring CPR.  We are asked to see the patient for evaluation management of acute renal failure which is most likely secondary to vancomycin toxicity. Patient was also receiving diuretics and is on lisinopril.  1. Acute renal failure: secondary to vancomycin toxicity, ACE inhibitor use, and dehydration secondary to diuretics. Nonoliguric urine outpatient. Creatinine appears to be stabilizing  2. Hypertension: better controlled -  imdur, and metoprolol - d/c amlodipine if BP drops - start Lasix  3. Anemia with renal failure: hemoglobin 7.2 - PRBC transfusion this admission  4. Acute Respiratory Failure: on mechanical ventilation.   5. Hypernatremia:Na 145, improved  6. Generalized edema - Start lasix   LOS: 0 Sean Arellano 8/15/20184:20 PM

## 2016-09-30 LAB — BASIC METABOLIC PANEL
ANION GAP: 10 (ref 5–15)
BUN: 76 mg/dL — ABNORMAL HIGH (ref 6–20)
CALCIUM: 8.5 mg/dL — AB (ref 8.9–10.3)
CO2: 34 mmol/L — AB (ref 22–32)
Chloride: 102 mmol/L (ref 101–111)
Creatinine, Ser: 2.17 mg/dL — ABNORMAL HIGH (ref 0.61–1.24)
GFR calc non Af Amer: 26 mL/min — ABNORMAL LOW (ref >60)
GFR, EST AFRICAN AMERICAN: 30 mL/min — AB (ref >60)
Glucose, Bld: 149 mg/dL — ABNORMAL HIGH (ref 65–99)
POTASSIUM: 3.8 mmol/L (ref 3.5–5.1)
Sodium: 146 mmol/L — ABNORMAL HIGH (ref 135–145)

## 2016-10-01 LAB — BASIC METABOLIC PANEL
ANION GAP: 7 (ref 5–15)
BUN: 74 mg/dL — ABNORMAL HIGH (ref 6–20)
CO2: 40 mmol/L — ABNORMAL HIGH (ref 22–32)
Calcium: 8.8 mg/dL — ABNORMAL LOW (ref 8.9–10.3)
Chloride: 100 mmol/L — ABNORMAL LOW (ref 101–111)
Creatinine, Ser: 2.31 mg/dL — ABNORMAL HIGH (ref 0.61–1.24)
GFR calc Af Amer: 28 mL/min — ABNORMAL LOW (ref >60)
GFR, EST NON AFRICAN AMERICAN: 24 mL/min — AB (ref >60)
Glucose, Bld: 148 mg/dL — ABNORMAL HIGH (ref 65–99)
POTASSIUM: 3.3 mmol/L — AB (ref 3.5–5.1)
SODIUM: 147 mmol/L — AB (ref 135–145)

## 2016-10-01 LAB — CBC
HCT: 24.7 % — ABNORMAL LOW (ref 39.0–52.0)
Hemoglobin: 7.3 g/dL — ABNORMAL LOW (ref 13.0–17.0)
MCH: 28.7 pg (ref 26.0–34.0)
MCHC: 29.6 g/dL — ABNORMAL LOW (ref 30.0–36.0)
MCV: 97.2 fL (ref 78.0–100.0)
PLATELETS: 223 10*3/uL (ref 150–400)
RBC: 2.54 MIL/uL — AB (ref 4.22–5.81)
RDW: 17.3 % — ABNORMAL HIGH (ref 11.5–15.5)
WBC: 9.5 10*3/uL (ref 4.0–10.5)

## 2016-10-01 LAB — MAGNESIUM: MAGNESIUM: 2.1 mg/dL (ref 1.7–2.4)

## 2016-10-01 NOTE — Progress Notes (Signed)
[facility]  ROUNDING NOTE   Subjective:   Patient  Laying in the bed Trach/vent in place Able to follow commands      Objective:  Vital signs in last 24 hours:  Temperature 98, pulse 75, respirations 22, blood pressure 115/68 Vent fio2 50%, peep 8  Physical Exam: General: No acute distress  Head:  [place]/AT,   Eyes: Anicteric  Neck: +tracheostomy  Lungs:  Ventilator assisted, coarse breath sounds bilaterally   Heart: S1S2 no rubs. Irregular rhythm  Abdomen:  Soft, nontender, PEG in place  Extremities: + dependent peripheral edema.  Neurologic: Awake, alert, following commands,   Skin: No acute lesions  GU:  foley    Basic Metabolic Panel:  Recent Labs Lab 09/25/16 0626  09/25/16 7846 09/26/16 0449 09/27/16 0526 09/28/16 0507 09/29/16 0709 09/30/16 1629 10/01/16 0638 10/01/16 1451  NA  --   < > 148* 147* 144 145 145 146* 147*  --   K  --   < > 3.5 3.4* 4.3 4.8 3.6 3.8 3.3*  --   CL  --   < > 105 105 103 104 101 102 100*  --   CO2  --   < > 28 35* 33* 33* 35* 34* 40*  --   GLUCOSE  --   < > 121* 116* 129* 124* 144* 149* 148*  --   BUN  --   < > 76* 75* 74* 78* 74* 76* 74*  --   CREATININE  --   < > 2.22* 2.12* 2.10* 2.25* 2.10* 2.17* 2.31*  --   CALCIUM  --   < > 8.6* 8.5* 8.3* 8.3* 8.7* 8.5* 8.8*  --   MG 1.9  --   --  1.9 1.9  --   --   --   --  2.1  PHOS  --   --  3.1 2.7 2.8  --   --   --   --   --   < > = values in this interval not displayed.  Liver Function Tests:  Recent Labs Lab 09/25/16 0637 09/26/16 0449 09/27/16 0526  ALBUMIN 2.4* 2.2* 2.2*   No results for input(s): LIPASE, AMYLASE in the last 168 hours. No results for input(s): AMMONIA in the last 168 hours.  CBC:  Recent Labs Lab 09/26/16 0449 09/27/16 0644 09/28/16 0721 09/29/16 0709 10/01/16 0638  WBC 5.3 6.3 7.1 6.8 9.5  HGB 7.6* 7.5* 7.5* 7.2* 7.3*  HCT 24.5* 24.6* 24.4* 23.6* 24.7*  MCV 93.5 94.3 94.9 94.8 97.2  PLT 46* 67* 88* 132* 223    Cardiac  Enzymes: No results for input(s): CKTOTAL, CKMB, CKMBINDEX, TROPONINI in the last 168 hours.  BNP: Invalid input(s): POCBNP  CBG: No results for input(s): GLUCAP in the last 168 hours.  Microbiology: Results for orders placed or performed during the hospital encounter of 08/31/16  Culture, Urine     Status: None   Collection Time: 09/06/16  1:02 PM  Result Value Ref Range Status   Specimen Description URINE, RANDOM  Final   Special Requests NONE  Final   Culture NO GROWTH  Final   Report Status 09/08/2016 FINAL  Final  Culture, respiratory (NON-Expectorated)     Status: None   Collection Time: 09/08/16  4:19 PM  Result Value Ref Range Status   Specimen Description TRACHEAL ASPIRATE  Final   Special Requests NONE  Final   Gram Stain   Final    ABUNDANT WBC PRESENT,BOTH PMN AND MONONUCLEAR  NO ORGANISMS SEEN    Culture   Final    RARE FUNGUS (MOLD) ISOLATED, PROBABLE CONTAMINANT/COLONIZER (SAPROPHYTE). CONTACT MICROBIOLOGY IF FURTHER IDENTIFICATION REQUIRED [phone].   Report Status 09/11/2016 FINAL  Final  C difficile quick scan w PCR reflex     Status: None   Collection Time: 09/09/16  8:20 AM  Result Value Ref Range Status   C Diff antigen NEGATIVE NEGATIVE Final   C Diff toxin NEGATIVE NEGATIVE Final   C Diff interpretation No C. difficile detected.  Final  Culture, blood (routine x 2)     Status: None   Collection Time: 09/10/16 11:18 AM  Result Value Ref Range Status   Specimen Description BLOOD RIGHT HAND  Final   Special Requests IN PEDIATRIC BOTTLE Blood Culture adequate volume  Final   Culture NO GROWTH 5 DAYS  Final   Report Status 09/15/2016 FINAL  Final  Culture, blood (routine x 2)     Status: None   Collection Time: 09/10/16 11:18 AM  Result Value Ref Range Status   Specimen Description BLOOD RIGHT HAND  Final   Special Requests IN PEDIATRIC BOTTLE Blood Culture adequate volume  Final   Culture NO GROWTH 5 DAYS  Final   Report Status 09/15/2016 FINAL   Final    Coagulation Studies: No results for input(s): LABPROT, INR in the last 72 hours.  Urinalysis: No results for input(s): COLORURINE, LABSPEC, PHURINE, GLUCOSEU, HGBUR, BILIRUBINUR, KETONESUR, PROTEINUR, UROBILINOGEN, NITRITE, LEUKOCYTESUR in the last 72 hours.  Invalid input(s): APPERANCEUR    Imaging: No results found.   Medications:       Assessment/ Plan:  83 y.o. black male with a PMHx of Obstructive sleep apnea, recent cardiac arrest, COPD, who was admitted to [facility] on 08/31/2016 for ongoing treatment of respiratory failure. He was admitted to the referring hospital on 08/19/2016 for acute on chronic hypercarbic respiratory failure secondary to COPD exacerbation. He also was felt to have community acquired pneumonia at that time. Unfortunately the patient deteriorated and suffered cardiopulmonary arrest secondary to torsades and ventricular fibrillation requiring CPR.  We are asked to see the patient for evaluation management of acute renal failure which is most likely secondary to vancomycin toxicity. Patient was also receiving diuretics and is on lisinopril.  1. Acute renal failure: secondary to vancomycin toxicity, ACE inhibitor use, and dehydration secondary to diuretics. Electrolytes and volume status are acceptable. No acute indication for dialysis  2. Hypertension: better controlled -  imdur, and metoprolol - d/c amlodipine  - Continue Lasix -Potassium supplementation as necessary for hypokalemia  3. Anemia with renal failure: hemoglobin 7.3  - PRBC transfusion this admission  4. Acute Respiratory Failure: on mechanical ventilation.   5. Hypernatremia:Na 147 Currently getting D5W at 50 cc/h  6. Generalized edema - Continue lasix, changed to per tube   7. Hypokalemia Supplement potassium as necessary. Magnesium level is normal   LOS: 0 Shaylea Ucci 8/17/20184:09 PM

## 2016-10-02 LAB — BASIC METABOLIC PANEL
Anion gap: 10 (ref 5–15)
BUN: 71 mg/dL — ABNORMAL HIGH (ref 6–20)
CHLORIDE: 98 mmol/L — AB (ref 101–111)
CO2: 37 mmol/L — AB (ref 22–32)
Calcium: 8.7 mg/dL — ABNORMAL LOW (ref 8.9–10.3)
Creatinine, Ser: 2.14 mg/dL — ABNORMAL HIGH (ref 0.61–1.24)
GFR calc non Af Amer: 26 mL/min — ABNORMAL LOW (ref >60)
GFR, EST AFRICAN AMERICAN: 31 mL/min — AB (ref >60)
Glucose, Bld: 165 mg/dL — ABNORMAL HIGH (ref 65–99)
POTASSIUM: 3.6 mmol/L (ref 3.5–5.1)
SODIUM: 145 mmol/L (ref 135–145)

## 2016-10-03 LAB — BLOOD GAS, ARTERIAL
ACID-BASE EXCESS: 9.3 mmol/L — AB (ref 0.0–2.0)
BICARBONATE: 34.5 mmol/L — AB (ref 20.0–28.0)
FIO2: 100
LHR: 25 {breaths}/min
MECHVT: 550 mL
O2 SAT: 93.7 %
PATIENT TEMPERATURE: 98.6
PCO2 ART: 58.8 mmHg — AB (ref 32.0–48.0)
PEEP/CPAP: 8 cmH2O
PO2 ART: 71.2 mmHg — AB (ref 83.0–108.0)
pH, Arterial: 7.387 (ref 7.350–7.450)

## 2016-10-03 LAB — BASIC METABOLIC PANEL WITH GFR
Anion gap: 11 (ref 5–15)
BUN: 71 mg/dL — ABNORMAL HIGH (ref 6–20)
CO2: 36 mmol/L — ABNORMAL HIGH (ref 22–32)
Calcium: 8.6 mg/dL — ABNORMAL LOW (ref 8.9–10.3)
Chloride: 96 mmol/L — ABNORMAL LOW (ref 101–111)
Creatinine, Ser: 2 mg/dL — ABNORMAL HIGH (ref 0.61–1.24)
GFR calc Af Amer: 33 mL/min — ABNORMAL LOW (ref >60)
GFR calc non Af Amer: 29 mL/min — ABNORMAL LOW (ref >60)
Glucose, Bld: 128 mg/dL — ABNORMAL HIGH (ref 65–99)
Potassium: 3.8 mmol/L (ref 3.5–5.1)
Sodium: 143 mmol/L (ref 135–145)

## 2016-10-04 LAB — BASIC METABOLIC PANEL
ANION GAP: 17 — AB (ref 5–15)
BUN: 78 mg/dL — ABNORMAL HIGH (ref 6–20)
CHLORIDE: 97 mmol/L — AB (ref 101–111)
CO2: 33 mmol/L — ABNORMAL HIGH (ref 22–32)
Calcium: 8.3 mg/dL — ABNORMAL LOW (ref 8.9–10.3)
Creatinine, Ser: 2.32 mg/dL — ABNORMAL HIGH (ref 0.61–1.24)
GFR calc non Af Amer: 24 mL/min — ABNORMAL LOW (ref >60)
GFR, EST AFRICAN AMERICAN: 28 mL/min — AB (ref >60)
Glucose, Bld: 94 mg/dL (ref 65–99)
POTASSIUM: 3.7 mmol/L (ref 3.5–5.1)
SODIUM: 147 mmol/L — AB (ref 135–145)

## 2016-10-05 LAB — URINALYSIS, ROUTINE W REFLEX MICROSCOPIC
BILIRUBIN URINE: NEGATIVE
Glucose, UA: NEGATIVE mg/dL
Ketones, ur: NEGATIVE mg/dL
Nitrite: NEGATIVE
Protein, ur: 30 mg/dL — AB
SPECIFIC GRAVITY, URINE: 1.011 (ref 1.005–1.030)
pH: 5 (ref 5.0–8.0)

## 2016-10-05 LAB — RENAL FUNCTION PANEL
Albumin: 2.1 g/dL — ABNORMAL LOW (ref 3.5–5.0)
Anion gap: 8 (ref 5–15)
BUN: 81 mg/dL — ABNORMAL HIGH (ref 6–20)
CALCIUM: 8.1 mg/dL — AB (ref 8.9–10.3)
CO2: 38 mmol/L — AB (ref 22–32)
CREATININE: 2.57 mg/dL — AB (ref 0.61–1.24)
Chloride: 97 mmol/L — ABNORMAL LOW (ref 101–111)
GFR calc Af Amer: 25 mL/min — ABNORMAL LOW (ref >60)
GFR, EST NON AFRICAN AMERICAN: 21 mL/min — AB (ref >60)
Glucose, Bld: 126 mg/dL — ABNORMAL HIGH (ref 65–99)
Phosphorus: 2.3 mg/dL — ABNORMAL LOW (ref 2.5–4.6)
Potassium: 3.3 mmol/L — ABNORMAL LOW (ref 3.5–5.1)
SODIUM: 143 mmol/L (ref 135–145)

## 2016-10-05 LAB — CBC
HCT: 22 % — ABNORMAL LOW (ref 39.0–52.0)
Hemoglobin: 6.6 g/dL — CL (ref 13.0–17.0)
MCH: 28.6 pg (ref 26.0–34.0)
MCHC: 30 g/dL (ref 30.0–36.0)
MCV: 95.2 fL (ref 78.0–100.0)
Platelets: 330 10*3/uL (ref 150–400)
RBC: 2.31 MIL/uL — ABNORMAL LOW (ref 4.22–5.81)
RDW: 18.1 % — AB (ref 11.5–15.5)
WBC: 12 10*3/uL — AB (ref 4.0–10.5)

## 2016-10-05 LAB — MAGNESIUM: MAGNESIUM: 1.8 mg/dL (ref 1.7–2.4)

## 2016-10-05 LAB — PREPARE RBC (CROSSMATCH)

## 2016-10-06 LAB — BPAM RBC
BLOOD PRODUCT EXPIRATION DATE: 201808292359
ISSUE DATE / TIME: 201808211222
UNIT TYPE AND RH: 7300

## 2016-10-06 LAB — TYPE AND SCREEN
ABO/RH(D): B POS
Antibody Screen: NEGATIVE
UNIT DIVISION: 0

## 2016-10-06 LAB — CBC
HEMATOCRIT: 26.7 % — AB (ref 39.0–52.0)
HEMOGLOBIN: 8 g/dL — AB (ref 13.0–17.0)
MCH: 28 pg (ref 26.0–34.0)
MCHC: 30 g/dL (ref 30.0–36.0)
MCV: 93.4 fL (ref 78.0–100.0)
Platelets: 337 10*3/uL (ref 150–400)
RBC: 2.86 MIL/uL — AB (ref 4.22–5.81)
RDW: 18.6 % — AB (ref 11.5–15.5)
WBC: 13 10*3/uL — AB (ref 4.0–10.5)

## 2016-10-07 ENCOUNTER — Other Ambulatory Visit (HOSPITAL_COMMUNITY): Payer: Medicare Other

## 2016-10-07 LAB — CBC WITH DIFFERENTIAL/PLATELET
BASOS ABS: 0 10*3/uL (ref 0.0–0.1)
BASOS PCT: 0 %
Eosinophils Absolute: 0 10*3/uL (ref 0.0–0.7)
Eosinophils Relative: 0 %
HEMATOCRIT: 26.8 % — AB (ref 39.0–52.0)
HEMOGLOBIN: 7.9 g/dL — AB (ref 13.0–17.0)
Lymphocytes Relative: 22 %
Lymphs Abs: 3.4 10*3/uL (ref 0.7–4.0)
MCH: 28 pg (ref 26.0–34.0)
MCHC: 29.5 g/dL — ABNORMAL LOW (ref 30.0–36.0)
MCV: 95 fL (ref 78.0–100.0)
MONOS PCT: 8 %
Monocytes Absolute: 1.3 10*3/uL — ABNORMAL HIGH (ref 0.1–1.0)
NEUTROS ABS: 10.4 10*3/uL — AB (ref 1.7–7.7)
NEUTROS PCT: 70 %
Platelets: 355 10*3/uL (ref 150–400)
RBC: 2.82 MIL/uL — ABNORMAL LOW (ref 4.22–5.81)
RDW: 18 % — ABNORMAL HIGH (ref 11.5–15.5)
WBC: 15 10*3/uL — ABNORMAL HIGH (ref 4.0–10.5)

## 2016-10-07 LAB — BLOOD CULTURE ID PANEL (REFLEXED)
Acinetobacter baumannii: NOT DETECTED
CANDIDA ALBICANS: NOT DETECTED
CANDIDA GLABRATA: NOT DETECTED
Candida krusei: NOT DETECTED
Candida parapsilosis: NOT DETECTED
Candida tropicalis: NOT DETECTED
ENTEROBACTER CLOACAE COMPLEX: NOT DETECTED
ENTEROBACTERIACEAE SPECIES: NOT DETECTED
ENTEROCOCCUS SPECIES: NOT DETECTED
Escherichia coli: NOT DETECTED
Haemophilus influenzae: NOT DETECTED
Klebsiella oxytoca: NOT DETECTED
Klebsiella pneumoniae: NOT DETECTED
LISTERIA MONOCYTOGENES: NOT DETECTED
METHICILLIN RESISTANCE: NOT DETECTED
NEISSERIA MENINGITIDIS: NOT DETECTED
Proteus species: NOT DETECTED
Pseudomonas aeruginosa: NOT DETECTED
STAPHYLOCOCCUS SPECIES: DETECTED — AB
STREPTOCOCCUS AGALACTIAE: NOT DETECTED
STREPTOCOCCUS PYOGENES: NOT DETECTED
STREPTOCOCCUS SPECIES: NOT DETECTED
Serratia marcescens: NOT DETECTED
Staphylococcus aureus (BCID): NOT DETECTED
Streptococcus pneumoniae: NOT DETECTED

## 2016-10-07 LAB — RENAL FUNCTION PANEL
ALBUMIN: 2.1 g/dL — AB (ref 3.5–5.0)
ANION GAP: 9 (ref 5–15)
BUN: 80 mg/dL — ABNORMAL HIGH (ref 6–20)
CO2: 38 mmol/L — ABNORMAL HIGH (ref 22–32)
Calcium: 8.2 mg/dL — ABNORMAL LOW (ref 8.9–10.3)
Chloride: 94 mmol/L — ABNORMAL LOW (ref 101–111)
Creatinine, Ser: 2.51 mg/dL — ABNORMAL HIGH (ref 0.61–1.24)
GFR, EST AFRICAN AMERICAN: 25 mL/min — AB (ref >60)
GFR, EST NON AFRICAN AMERICAN: 22 mL/min — AB (ref >60)
Glucose, Bld: 146 mg/dL — ABNORMAL HIGH (ref 65–99)
PHOSPHORUS: 2.9 mg/dL (ref 2.5–4.6)
POTASSIUM: 3.7 mmol/L (ref 3.5–5.1)
Sodium: 141 mmol/L (ref 135–145)

## 2016-10-08 LAB — URINE CULTURE: Culture: 70000 — AB

## 2016-10-08 LAB — CULTURE, RESPIRATORY

## 2016-10-08 LAB — CULTURE, RESPIRATORY W GRAM STAIN

## 2016-10-08 LAB — CULTURE, BLOOD (ROUTINE X 2): Special Requests: ADEQUATE

## 2016-10-09 LAB — VANCOMYCIN, TROUGH: Vancomycin Tr: 17 ug/mL (ref 15–20)

## 2016-10-10 LAB — CULTURE, BLOOD (ROUTINE X 2)
Culture: NO GROWTH
SPECIAL REQUESTS: ADEQUATE

## 2016-10-11 ENCOUNTER — Other Ambulatory Visit (HOSPITAL_COMMUNITY): Payer: Medicare Other

## 2016-10-11 LAB — CBC WITH DIFFERENTIAL/PLATELET
BASOS PCT: 0 %
Basophils Absolute: 0 10*3/uL (ref 0.0–0.1)
EOS ABS: 0 10*3/uL (ref 0.0–0.7)
Eosinophils Relative: 0 %
HCT: 25 % — ABNORMAL LOW (ref 39.0–52.0)
HEMOGLOBIN: 7.5 g/dL — AB (ref 13.0–17.0)
LYMPHS ABS: 1.2 10*3/uL (ref 0.7–4.0)
Lymphocytes Relative: 8 %
MCH: 29 pg (ref 26.0–34.0)
MCHC: 30 g/dL (ref 30.0–36.0)
MCV: 96.5 fL (ref 78.0–100.0)
MONO ABS: 1.1 10*3/uL — AB (ref 0.1–1.0)
MONOS PCT: 8 %
NEUTROS PCT: 84 %
Neutro Abs: 12.2 10*3/uL — ABNORMAL HIGH (ref 1.7–7.7)
Platelets: 275 10*3/uL (ref 150–400)
RBC: 2.59 MIL/uL — ABNORMAL LOW (ref 4.22–5.81)
RDW: 17.9 % — AB (ref 11.5–15.5)
WBC: 14.5 10*3/uL — ABNORMAL HIGH (ref 4.0–10.5)

## 2016-10-11 LAB — RENAL FUNCTION PANEL
ANION GAP: 13 (ref 5–15)
Albumin: 2.1 g/dL — ABNORMAL LOW (ref 3.5–5.0)
BUN: 83 mg/dL — ABNORMAL HIGH (ref 6–20)
CHLORIDE: 95 mmol/L — AB (ref 101–111)
CO2: 35 mmol/L — AB (ref 22–32)
CREATININE: 2.31 mg/dL — AB (ref 0.61–1.24)
Calcium: 8.6 mg/dL — ABNORMAL LOW (ref 8.9–10.3)
GFR calc non Af Amer: 24 mL/min — ABNORMAL LOW (ref >60)
GFR, EST AFRICAN AMERICAN: 28 mL/min — AB (ref >60)
GLUCOSE: 146 mg/dL — AB (ref 65–99)
Phosphorus: 3.8 mg/dL (ref 2.5–4.6)
Potassium: 4.2 mmol/L (ref 3.5–5.1)
Sodium: 143 mmol/L (ref 135–145)

## 2016-10-11 LAB — MAGNESIUM: MAGNESIUM: 2.1 mg/dL (ref 1.7–2.4)

## 2016-10-11 LAB — C DIFFICILE QUICK SCREEN W PCR REFLEX
C DIFFICILE (CDIFF) INTERP: NOT DETECTED
C DIFFICILE (CDIFF) TOXIN: NEGATIVE
C Diff antigen: NEGATIVE

## 2016-10-13 ENCOUNTER — Other Ambulatory Visit (HOSPITAL_COMMUNITY): Payer: Medicare Other

## 2016-10-13 LAB — GRAM STAIN

## 2016-10-13 LAB — BODY FLUID CELL COUNT WITH DIFFERENTIAL
Eos, Fluid: 0 %
Lymphs, Fluid: 26 %
Monocyte-Macrophage-Serous Fluid: 26 % — ABNORMAL LOW (ref 50–90)
Neutrophil Count, Fluid: 48 % — ABNORMAL HIGH (ref 0–25)
WBC FLUID: 375 uL (ref 0–1000)

## 2016-10-13 LAB — GLUCOSE, PLEURAL OR PERITONEAL FLUID: Glucose, Fluid: 130 mg/dL

## 2016-10-13 LAB — PROTEIN, PLEURAL OR PERITONEAL FLUID: Total protein, fluid: <3 g/dL

## 2016-10-13 MED ORDER — LIDOCAINE HCL (PF) 1 % IJ SOLN
INTRAMUSCULAR | Status: AC
  Filled 2016-10-13: qty 30

## 2016-10-13 NOTE — Procedures (Signed)
Ultrasound-guided diagnostic and therapeutic right thoracentesis performed yielding 2 liters of serosanguineous colored fluid.  We stopped at 2L as this was his first thoracentesis. No immediate complications. Follow-up chest x-ray pending.      [name] 3:45 PM 10/13/2016

## 2016-10-14 LAB — PH, BODY FLUID: pH, Body Fluid: 8

## 2016-10-15 ENCOUNTER — Other Ambulatory Visit (HOSPITAL_COMMUNITY): Payer: Medicare Other

## 2016-10-15 LAB — VANCOMYCIN, TROUGH: Vancomycin Tr: 32 ug/mL (ref 15–20)

## 2016-10-16 LAB — VANCOMYCIN, TROUGH: Vancomycin Tr: 37 ug/mL (ref 15–20)

## 2016-10-17 LAB — CBC
HCT: 27.4 % — ABNORMAL LOW (ref 39.0–52.0)
Hemoglobin: 7.8 g/dL — ABNORMAL LOW (ref 13.0–17.0)
MCH: 27 pg (ref 26.0–34.0)
MCHC: 28.5 g/dL — ABNORMAL LOW (ref 30.0–36.0)
MCV: 94.8 fL (ref 78.0–100.0)
PLATELETS: 250 10*3/uL (ref 150–400)
RBC: 2.89 MIL/uL — AB (ref 4.22–5.81)
RDW: 17.7 % — ABNORMAL HIGH (ref 11.5–15.5)
WBC: 17.4 10*3/uL — ABNORMAL HIGH (ref 4.0–10.5)

## 2016-10-17 LAB — BASIC METABOLIC PANEL
Anion gap: 9 (ref 5–15)
BUN: 76 mg/dL — AB (ref 6–20)
CO2: 37 mmol/L — ABNORMAL HIGH (ref 22–32)
CREATININE: 1.65 mg/dL — AB (ref 0.61–1.24)
Calcium: 8.3 mg/dL — ABNORMAL LOW (ref 8.9–10.3)
Chloride: 99 mmol/L — ABNORMAL LOW (ref 101–111)
GFR calc Af Amer: 42 mL/min — ABNORMAL LOW (ref >60)
GFR, EST NON AFRICAN AMERICAN: 36 mL/min — AB (ref >60)
Glucose, Bld: 139 mg/dL — ABNORMAL HIGH (ref 65–99)
POTASSIUM: 4.1 mmol/L (ref 3.5–5.1)
SODIUM: 145 mmol/L (ref 135–145)

## 2016-10-17 LAB — CREATININE, SERUM
CREATININE: 1.7 mg/dL — AB (ref 0.61–1.24)
GFR calc Af Amer: 41 mL/min — ABNORMAL LOW (ref >60)
GFR calc non Af Amer: 35 mL/min — ABNORMAL LOW (ref >60)

## 2016-10-17 LAB — VANCOMYCIN, TROUGH: VANCOMYCIN TR: 25 ug/mL — AB (ref 15–20)

## 2016-10-18 ENCOUNTER — Other Ambulatory Visit (HOSPITAL_COMMUNITY): Payer: Medicare Other

## 2016-10-18 LAB — CULTURE, BODY FLUID-BOTTLE

## 2016-10-18 LAB — BASIC METABOLIC PANEL
ANION GAP: 11 (ref 5–15)
BUN: 80 mg/dL — ABNORMAL HIGH (ref 6–20)
CO2: 33 mmol/L — ABNORMAL HIGH (ref 22–32)
Calcium: 8.3 mg/dL — ABNORMAL LOW (ref 8.9–10.3)
Chloride: 100 mmol/L — ABNORMAL LOW (ref 101–111)
Creatinine, Ser: 1.85 mg/dL — ABNORMAL HIGH (ref 0.61–1.24)
GFR calc Af Amer: 37 mL/min — ABNORMAL LOW (ref >60)
GFR, EST NON AFRICAN AMERICAN: 32 mL/min — AB (ref >60)
Glucose, Bld: 126 mg/dL — ABNORMAL HIGH (ref 65–99)
POTASSIUM: 4.8 mmol/L (ref 3.5–5.1)
SODIUM: 144 mmol/L (ref 135–145)

## 2016-10-18 LAB — CULTURE, BODY FLUID W GRAM STAIN -BOTTLE: Culture: NO GROWTH

## 2016-10-18 LAB — MAGNESIUM: MAGNESIUM: 2.5 mg/dL — AB (ref 1.7–2.4)

## 2016-10-18 LAB — VANCOMYCIN, TROUGH: VANCOMYCIN TR: 22 ug/mL — AB (ref 15–20)

## 2016-10-19 LAB — RENAL FUNCTION PANEL
ALBUMIN: 1.9 g/dL — AB (ref 3.5–5.0)
ANION GAP: 10 (ref 5–15)
BUN: 78 mg/dL — AB (ref 6–20)
CHLORIDE: 98 mmol/L — AB (ref 101–111)
CO2: 39 mmol/L — ABNORMAL HIGH (ref 22–32)
Calcium: 8.3 mg/dL — ABNORMAL LOW (ref 8.9–10.3)
Creatinine, Ser: 1.8 mg/dL — ABNORMAL HIGH (ref 0.61–1.24)
GFR calc Af Amer: 38 mL/min — ABNORMAL LOW (ref >60)
GFR calc non Af Amer: 33 mL/min — ABNORMAL LOW (ref >60)
Glucose, Bld: 144 mg/dL — ABNORMAL HIGH (ref 65–99)
PHOSPHORUS: 3.9 mg/dL (ref 2.5–4.6)
POTASSIUM: 3.3 mmol/L — AB (ref 3.5–5.1)
Sodium: 147 mmol/L — ABNORMAL HIGH (ref 135–145)

## 2016-10-19 LAB — CBC WITH DIFFERENTIAL/PLATELET
BASOS PCT: 0 %
Basophils Absolute: 0 10*3/uL (ref 0.0–0.1)
EOS PCT: 0 %
Eosinophils Absolute: 0 10*3/uL (ref 0.0–0.7)
HEMATOCRIT: 27.7 % — AB (ref 39.0–52.0)
Hemoglobin: 8 g/dL — ABNORMAL LOW (ref 13.0–17.0)
LYMPHS ABS: 1.1 10*3/uL (ref 0.7–4.0)
Lymphocytes Relative: 6 %
MCH: 27.7 pg (ref 26.0–34.0)
MCHC: 28.9 g/dL — AB (ref 30.0–36.0)
MCV: 95.8 fL (ref 78.0–100.0)
MONO ABS: 1.3 10*3/uL — AB (ref 0.1–1.0)
MONOS PCT: 7 %
NEUTROS ABS: 16.7 10*3/uL — AB (ref 1.7–7.7)
Neutrophils Relative %: 87 %
PLATELETS: 232 10*3/uL (ref 150–400)
RBC: 2.89 MIL/uL — ABNORMAL LOW (ref 4.22–5.81)
RDW: 17.7 % — ABNORMAL HIGH (ref 11.5–15.5)
WBC: 19.1 10*3/uL — AB (ref 4.0–10.5)

## 2016-10-19 LAB — MAGNESIUM: Magnesium: 2 mg/dL (ref 1.7–2.4)

## 2016-10-19 LAB — VANCOMYCIN, TROUGH: Vancomycin Tr: 18 ug/mL (ref 15–20)

## 2016-10-20 ENCOUNTER — Other Ambulatory Visit (HOSPITAL_COMMUNITY): Payer: Medicare Other

## 2016-10-20 LAB — RENAL FUNCTION PANEL
ANION GAP: 13 (ref 5–15)
Albumin: 1.9 g/dL — ABNORMAL LOW (ref 3.5–5.0)
BUN: 81 mg/dL — ABNORMAL HIGH (ref 6–20)
CALCIUM: 8.2 mg/dL — AB (ref 8.9–10.3)
CO2: 33 mmol/L — ABNORMAL HIGH (ref 22–32)
CREATININE: 1.82 mg/dL — AB (ref 0.61–1.24)
Chloride: 99 mmol/L — ABNORMAL LOW (ref 101–111)
GFR, EST AFRICAN AMERICAN: 37 mL/min — AB (ref >60)
GFR, EST NON AFRICAN AMERICAN: 32 mL/min — AB (ref >60)
Glucose, Bld: 123 mg/dL — ABNORMAL HIGH (ref 65–99)
PHOSPHORUS: 3.5 mg/dL (ref 2.5–4.6)
Potassium: 4 mmol/L (ref 3.5–5.1)
Sodium: 145 mmol/L (ref 135–145)

## 2016-10-20 LAB — MAGNESIUM: MAGNESIUM: 2 mg/dL (ref 1.7–2.4)

## 2016-10-20 MED ORDER — LIDOCAINE HCL (PF) 1 % IJ SOLN
INTRAMUSCULAR | Status: AC
  Filled 2016-10-20: qty 30

## 2016-10-20 NOTE — Procedures (Signed)
PROCEDURE SUMMARY:  Successful US guided right thoracentesis. Yielded 1.85 liters of clear orange fluid. Pt tolerated procedure well. No immediate complications.  CXR shows no pneumothorax.  Elysa Womac S Katesha Eichel PA-C 10/20/2016 9:59 AM

## 2016-10-21 ENCOUNTER — Other Ambulatory Visit (HOSPITAL_COMMUNITY): Payer: Medicare Other

## 2016-10-21 ENCOUNTER — Ambulatory Visit (HOSPITAL_COMMUNITY)
Admission: AD | Admit: 2016-10-21 | Discharge: 2016-10-21 | Disposition: A | Discharge status: Home / Self Care | Payer: Medicare Other | Source: Other Acute Inpatient Hospital | Attending: Internal Medicine | Admitting: Internal Medicine

## 2016-10-21 DIAGNOSIS — I10 Essential (primary) hypertension: Secondary | ICD-10-CM | POA: Insufficient documentation

## 2016-10-21 DIAGNOSIS — J9692 Respiratory failure, unspecified with hypercapnia: Secondary | ICD-10-CM | POA: Insufficient documentation

## 2016-10-21 DIAGNOSIS — J9602 Acute respiratory failure with hypercapnia: Secondary | ICD-10-CM | POA: Insufficient documentation

## 2016-10-21 DIAGNOSIS — J9601 Acute respiratory failure with hypoxia: Secondary | ICD-10-CM | POA: Diagnosis present

## 2016-10-21 DIAGNOSIS — J441 Chronic obstructive pulmonary disease with (acute) exacerbation: Secondary | ICD-10-CM | POA: Diagnosis not present

## 2016-10-21 LAB — RENAL FUNCTION PANEL
ALBUMIN: 1.9 g/dL — AB (ref 3.5–5.0)
Anion gap: 10 (ref 5–15)
BUN: 78 mg/dL — ABNORMAL HIGH (ref 6–20)
CALCIUM: 8.4 mg/dL — AB (ref 8.9–10.3)
CO2: 39 mmol/L — ABNORMAL HIGH (ref 22–32)
Chloride: 98 mmol/L — ABNORMAL LOW (ref 101–111)
Creatinine, Ser: 1.74 mg/dL — ABNORMAL HIGH (ref 0.61–1.24)
GFR calc Af Amer: 39 mL/min — ABNORMAL LOW (ref >60)
GFR, EST NON AFRICAN AMERICAN: 34 mL/min — AB (ref >60)
GLUCOSE: 136 mg/dL — AB (ref 65–99)
PHOSPHORUS: 3.3 mg/dL (ref 2.5–4.6)
Potassium: 3.4 mmol/L — ABNORMAL LOW (ref 3.5–5.1)
SODIUM: 147 mmol/L — AB (ref 135–145)

## 2016-10-21 LAB — MAGNESIUM: MAGNESIUM: 2.1 mg/dL (ref 1.7–2.4)

## 2016-10-21 LAB — CBC
HEMATOCRIT: 28.8 % — AB (ref 39.0–52.0)
HEMOGLOBIN: 8.3 g/dL — AB (ref 13.0–17.0)
MCH: 27.7 pg (ref 26.0–34.0)
MCHC: 28.8 g/dL — AB (ref 30.0–36.0)
MCV: 96 fL (ref 78.0–100.0)
Platelets: 203 10*3/uL (ref 150–400)
RBC: 3 MIL/uL — AB (ref 4.22–5.81)
RDW: 18.1 % — ABNORMAL HIGH (ref 11.5–15.5)
WBC: 15.7 10*3/uL — AB (ref 4.0–10.5)

## 2016-11-25 LAB — ACID FAST CULTURE WITH REFLEXED SENSITIVITIES (MYCOBACTERIA): Acid Fast Culture: NEGATIVE

## 2016-11-25 LAB — ACID FAST CULTURE WITH REFLEXED SENSITIVITIES

## 2017-01-15 DEATH — deceased

## 2017-09-14 IMAGING — US US ABDOMEN LIMITED
1 series · 10 of 10 positions shown · non-contrast
Comparison: CT scan of the abdomen dated 09/20/2016

CLINICAL DATA: Ascites.

EXAM:
LIMITED ABDOMEN ULTRASOUND FOR ASCITES
TECHNIQUE: Limited ultrasound survey for ascites was performed in all four
abdominal quadrants.

[Series 1: us abdomen limited · 0.27mm/px · 10 of 10 slices shown]
[im 1/10]
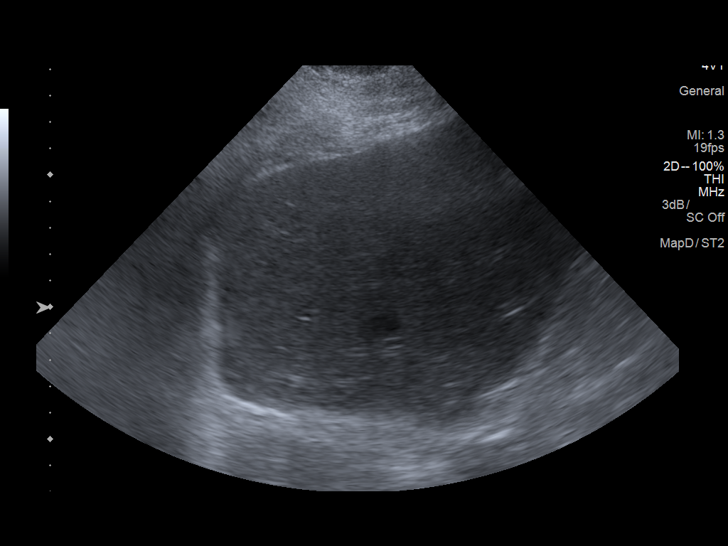
[im 2/10]
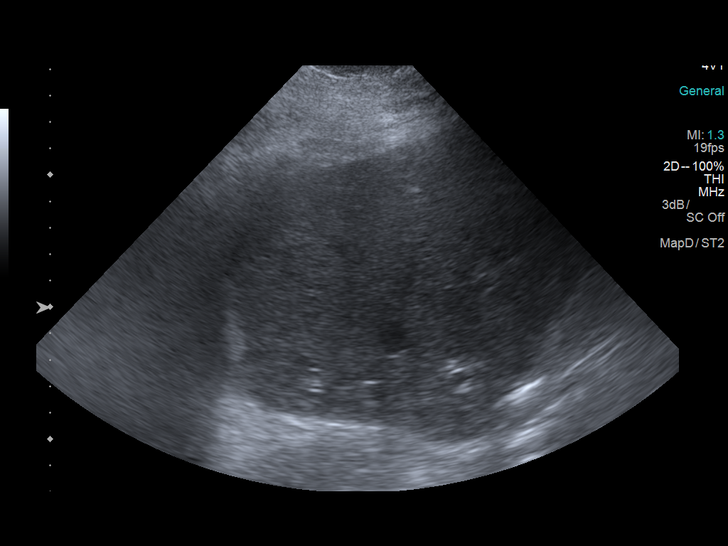
[im 3/10]
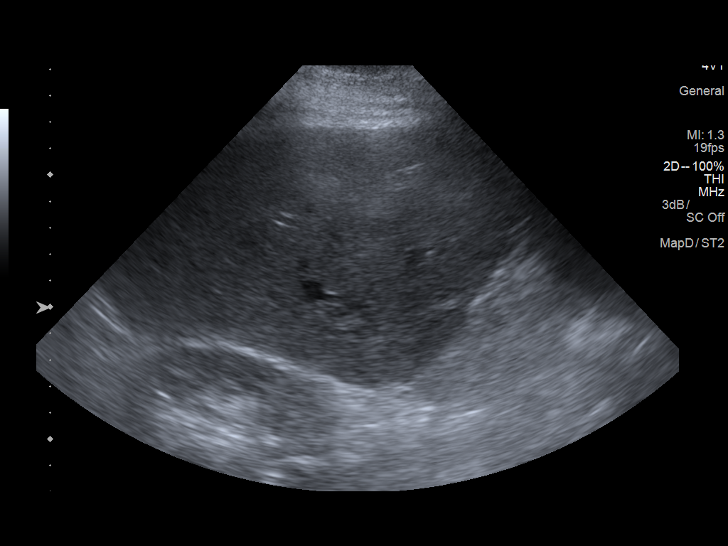
[im 4/10]
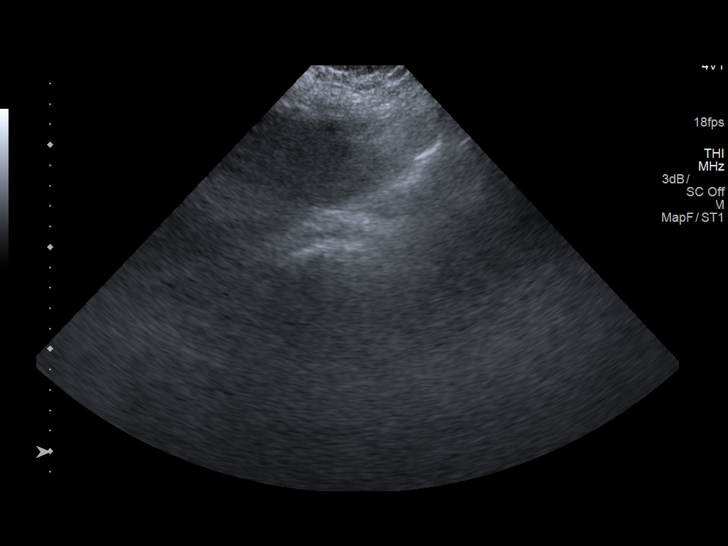
[im 5/10]
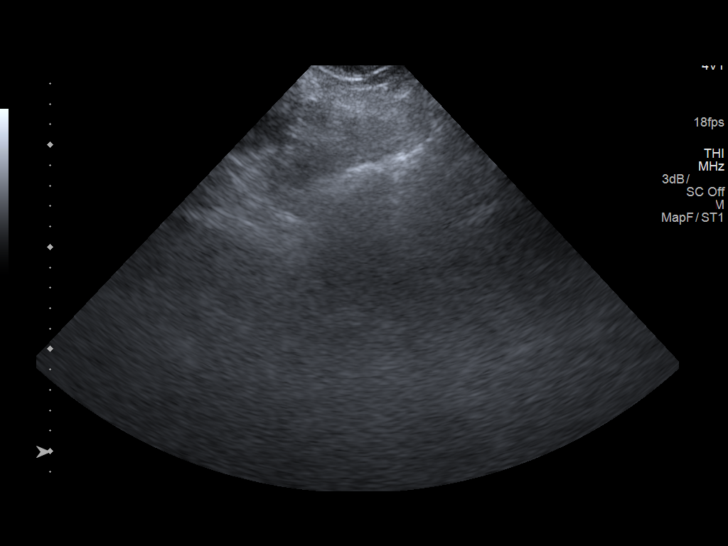
[im 6/10]
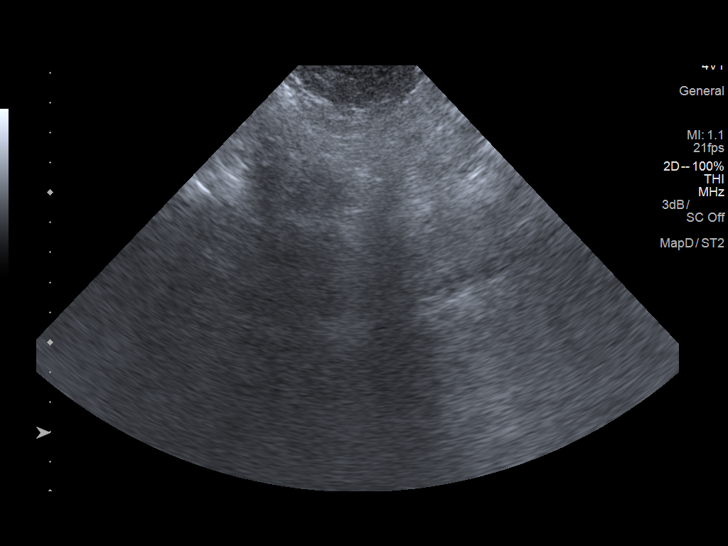
[im 7/10]
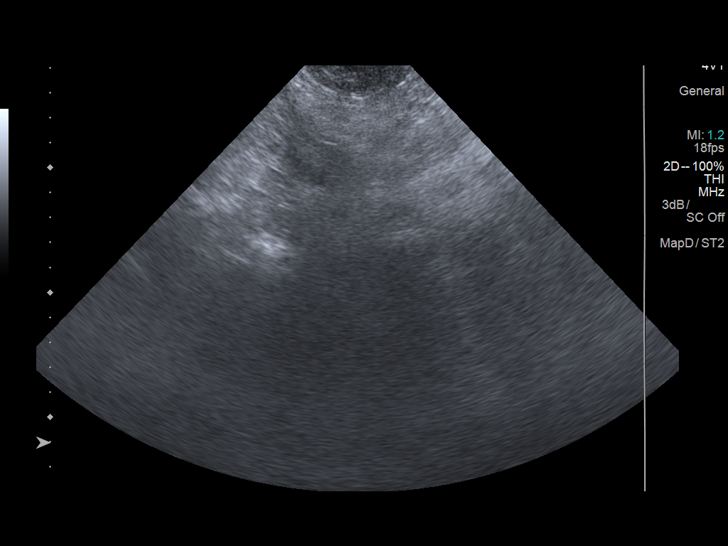
[im 8/10]
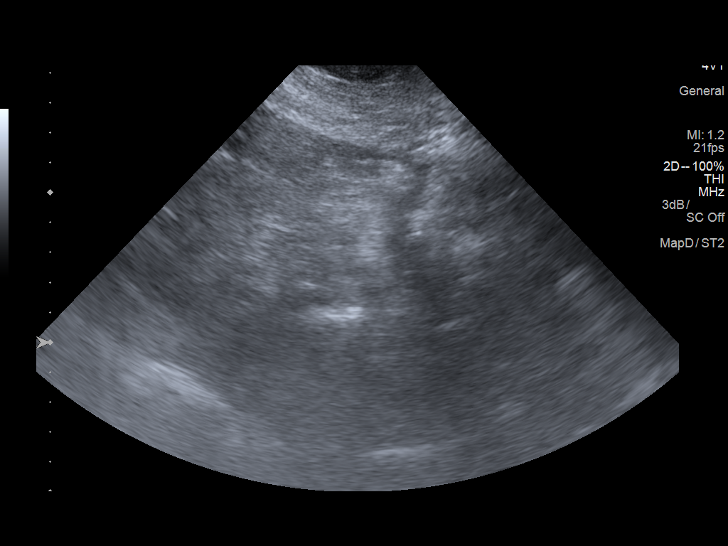
[im 9/10]
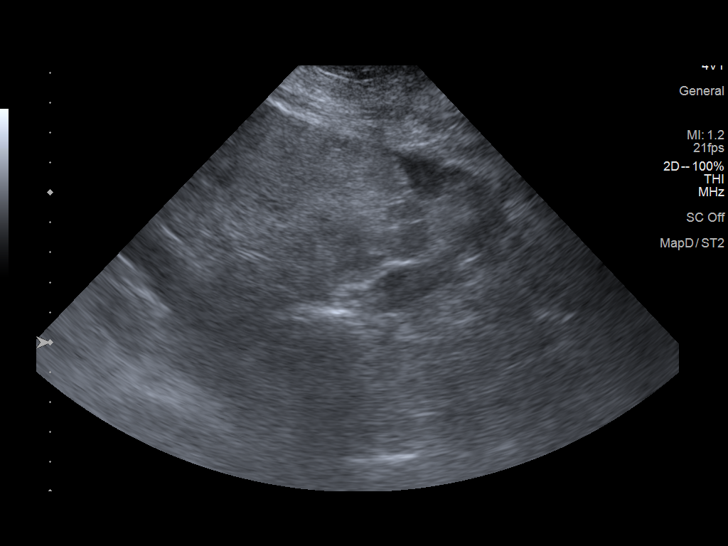
[im 10/10]
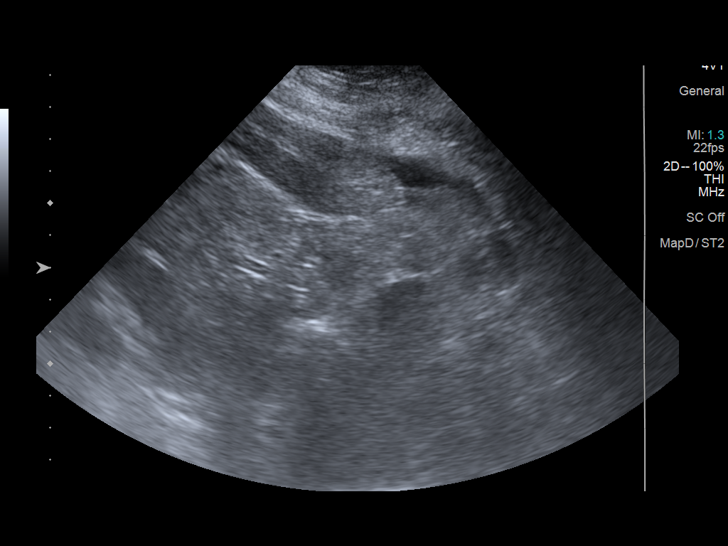

[10 of 10 positions shown; findings below may reference images not displayed]

FINDINGS: There is only a trace amount of ascites in the left lower quadrant.
The other quadrants demonstrate no ascites.
IMPRESSION: Trace amount of ascites in the left lower quadrant.

## 2018-02-23 IMAGING — DX DG CHEST 1V PORT
1 series · 2 of 2 positions shown · non-contrast
Comparison: 09/21/2016

CLINICAL DATA: Respiratory failure

EXAM:
PORTABLE CHEST 1 VIEW

[Series 1: chest · 0.14mm/px · 2 of 2 slices shown]
[im 1/2]
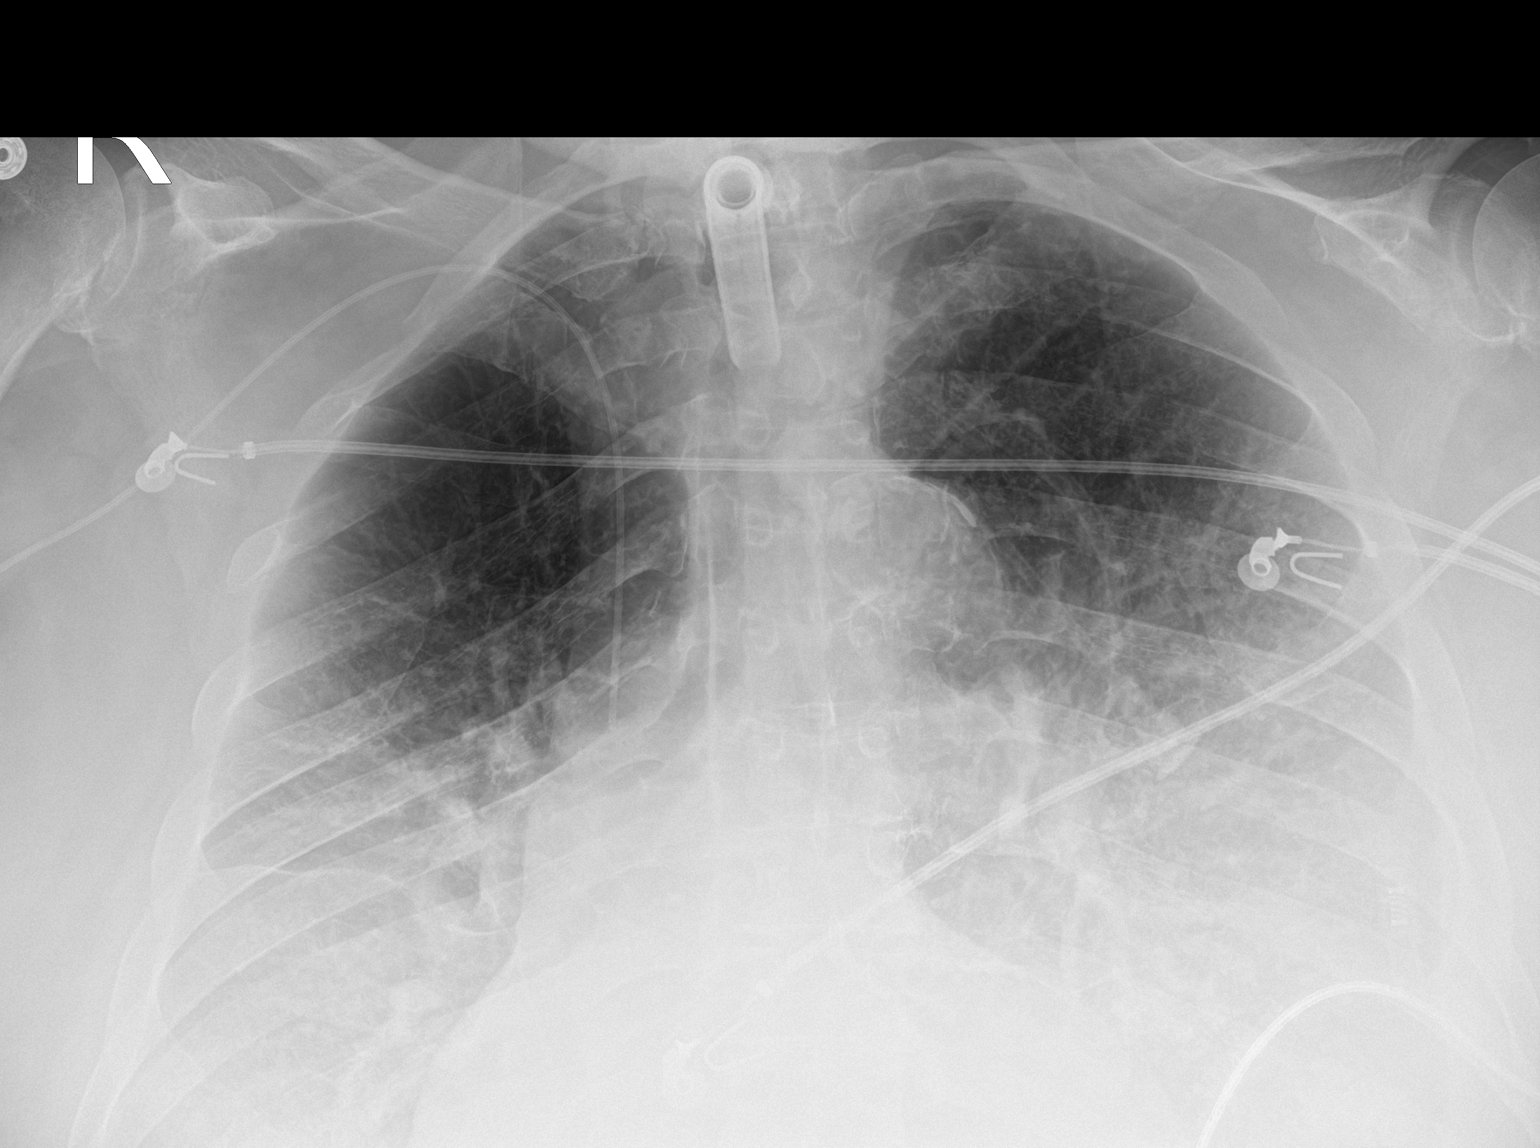
[im 2/2]
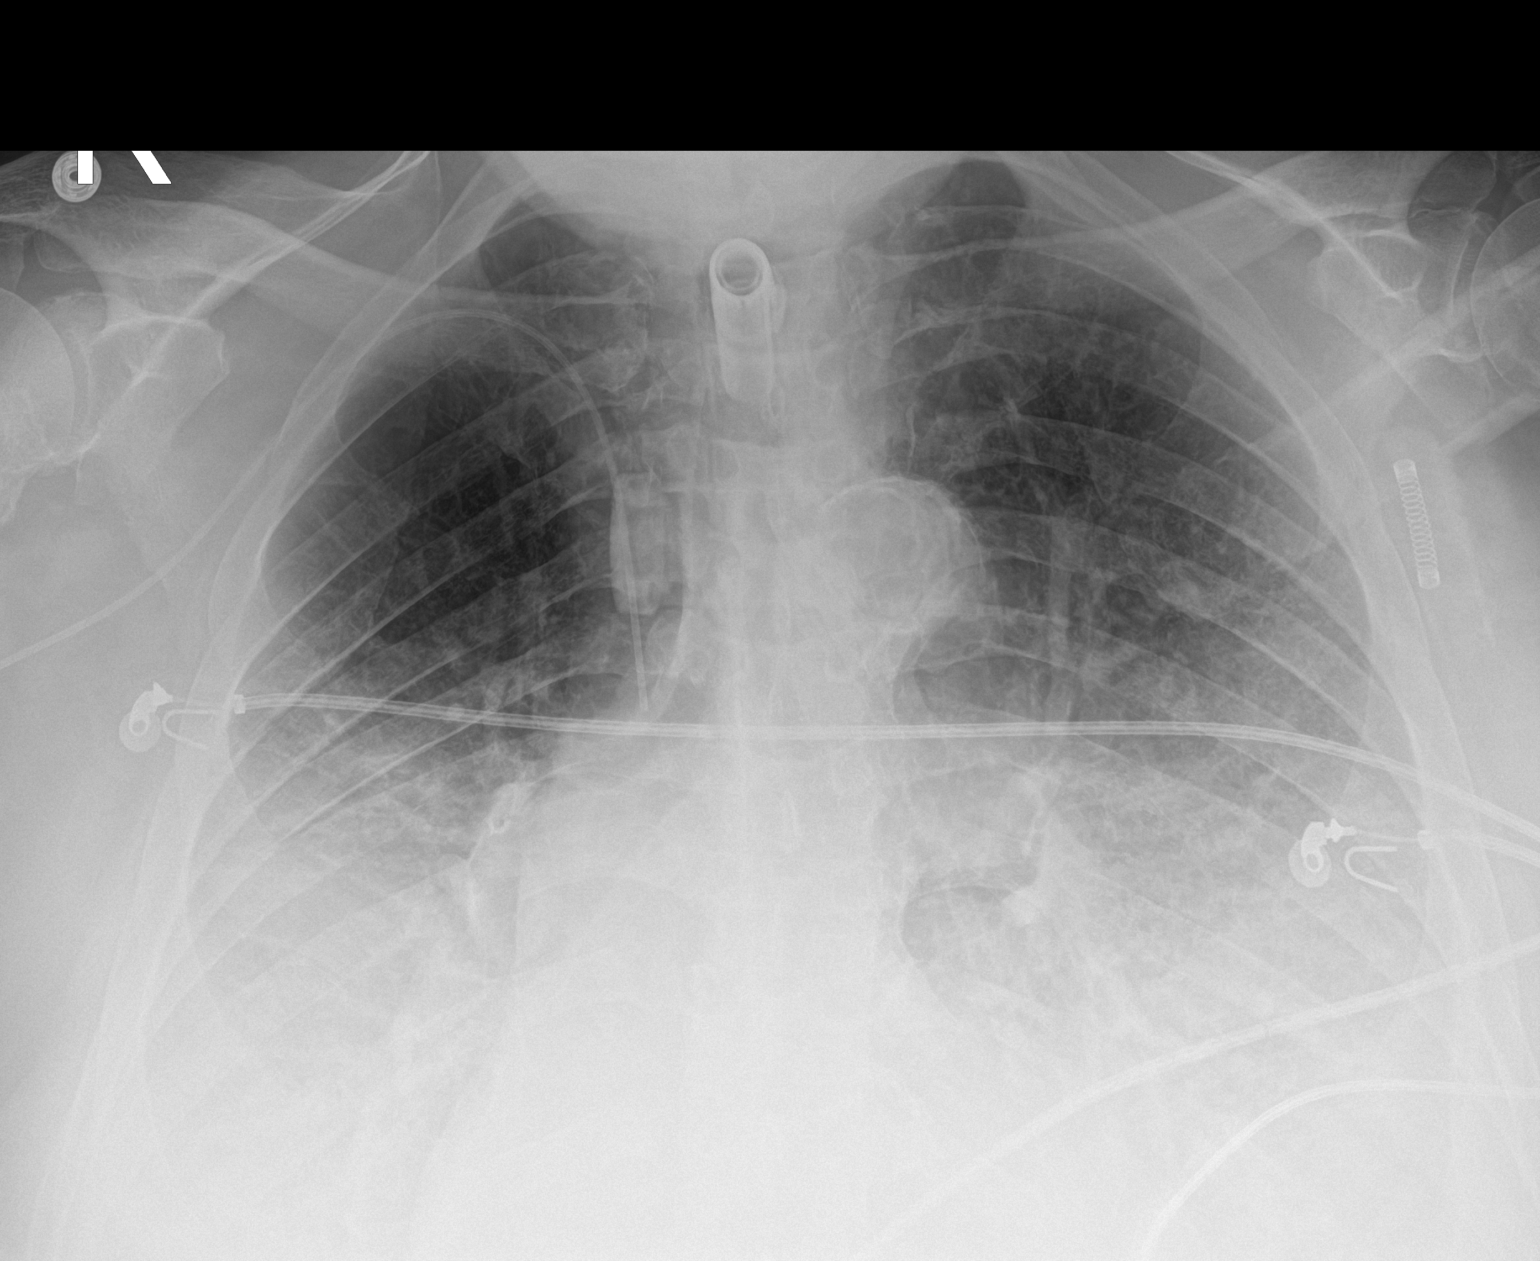

[2 of 2 positions shown; findings below may reference images not displayed]

FINDINGS: Tracheostomy tube and right-sided PICC line are again seen and
stable. Nasogastric catheter has been removed in the interval.
Cardiac shadow is enlarged but stable. Increasing bibasilar
infiltrates are noted. Small effusions are present as well. No bony
abnormality is noted.
IMPRESSION: Increasing bibasilar infiltrates.

## 2018-02-24 IMAGING — DX DG CHEST 1V PORT
1 series · 1 of 1 positions shown · non-contrast
Comparison: Portable exam 9097 hours compared 09/25/2016

CLINICAL DATA: Respiratory failure, tracheostomy, COPD,
hypertension, former smoker

EXAM:
PORTABLE CHEST 1 VIEW

[chest ap]
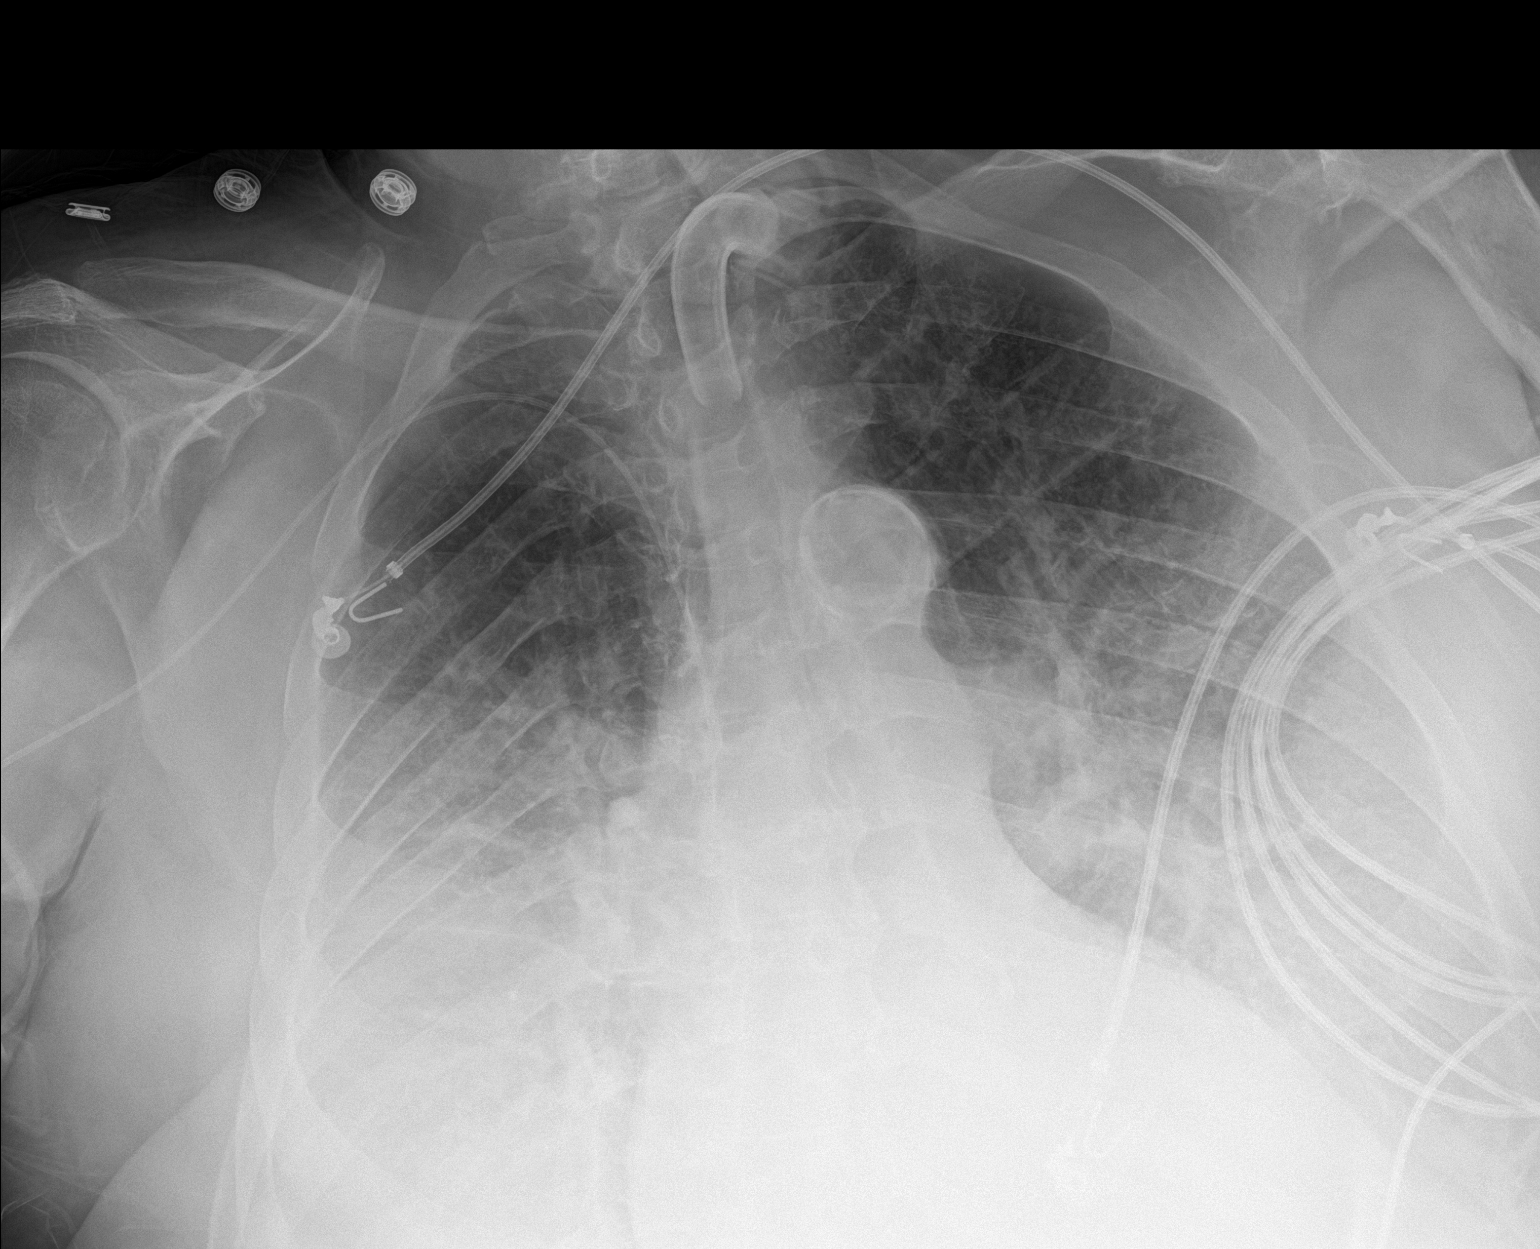

[1 of 1 positions shown; findings below may reference images not displayed]

FINDINGS: Tip of tracheostomy tube projects 8.6 cm above carina.

Tip of RIGHT arm PICC line projects over SVC.

Enlargement of cardiac silhouette.

BILATERAL pulmonary infiltrates at the mid to lower lungs unchanged.

Upper lungs clear.

No pneumothorax.

Bones demineralized.

Incidentally noted atherosclerotic calcification aorta.
IMPRESSION: Persistent BILATERAL pulmonary infiltrates at the mid to lower
lungs.

## 2018-03-11 IMAGING — DX DG CHEST 1V PORT
1 series · 2 of 2 positions shown · non-contrast
Comparison: Chest radiograph 10/07/2016

CLINICAL DATA: Patient with history of pleural effusion.

EXAM:
PORTABLE CHEST 1 VIEW

[Series 1: chest · 0.14mm/px · 2 of 2 slices shown]
[im 1/2]
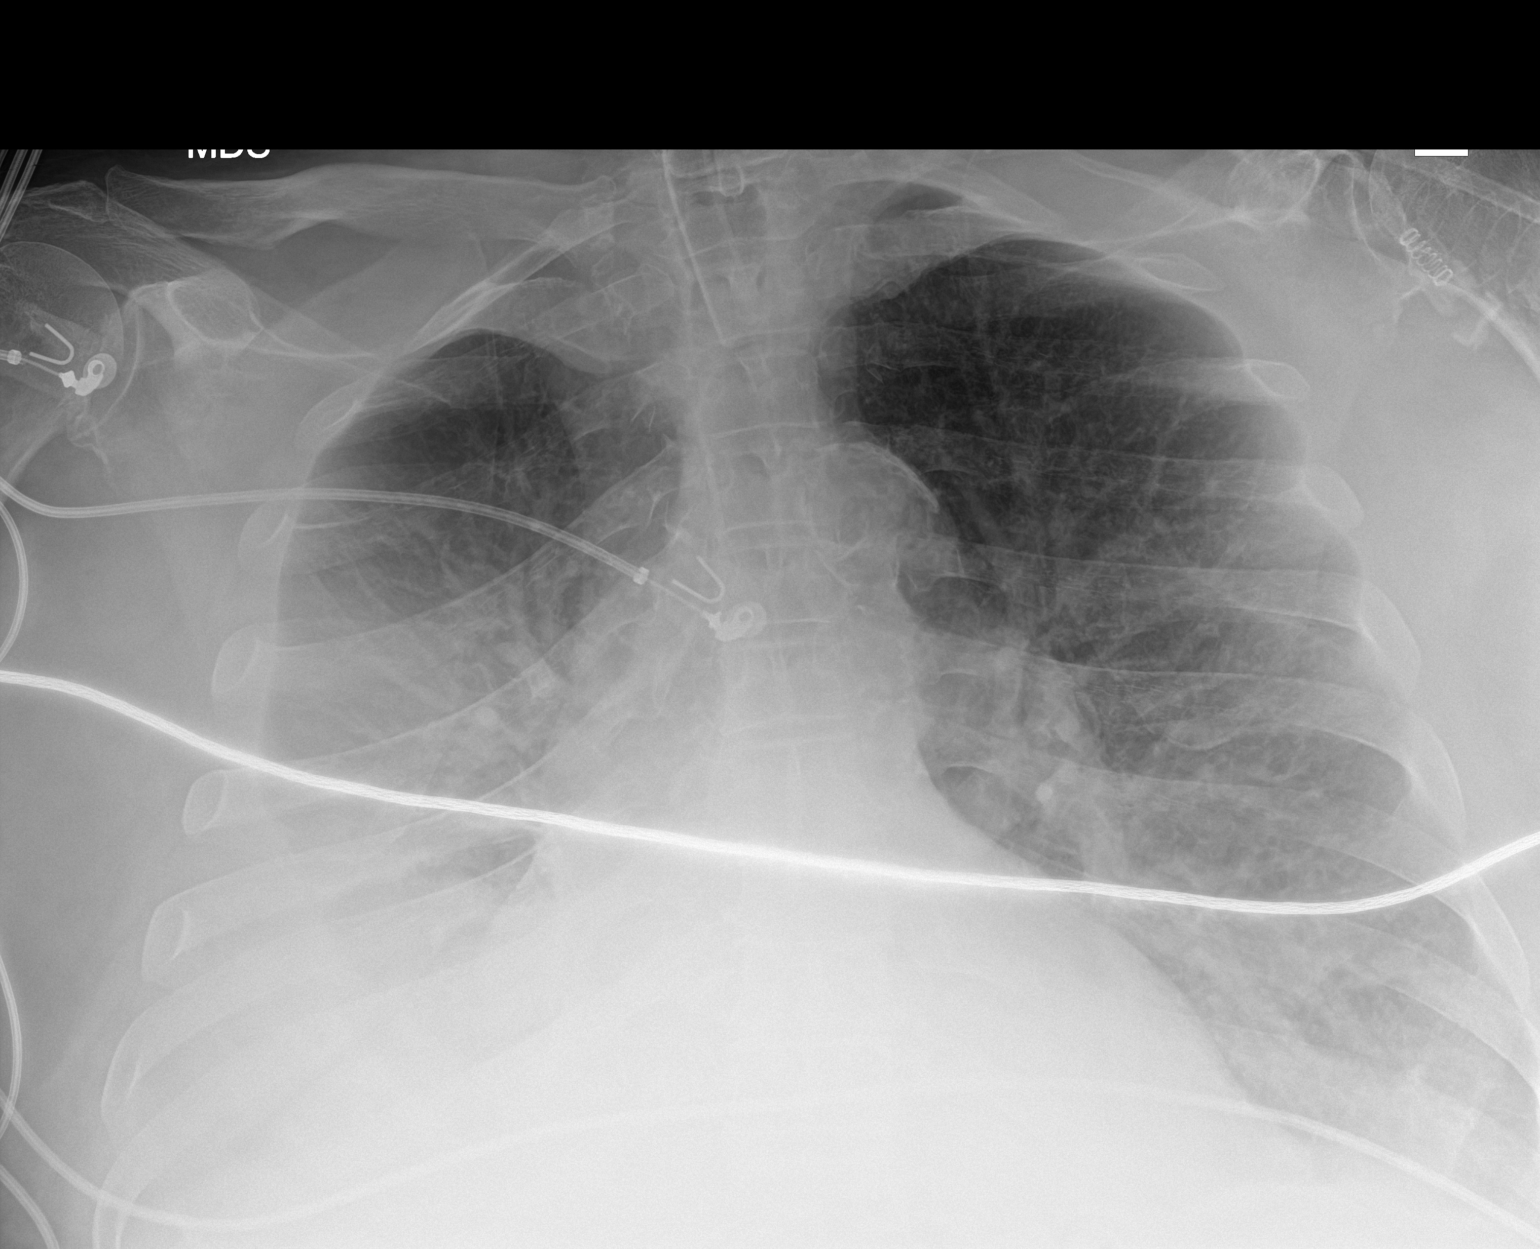
[im 2/2]
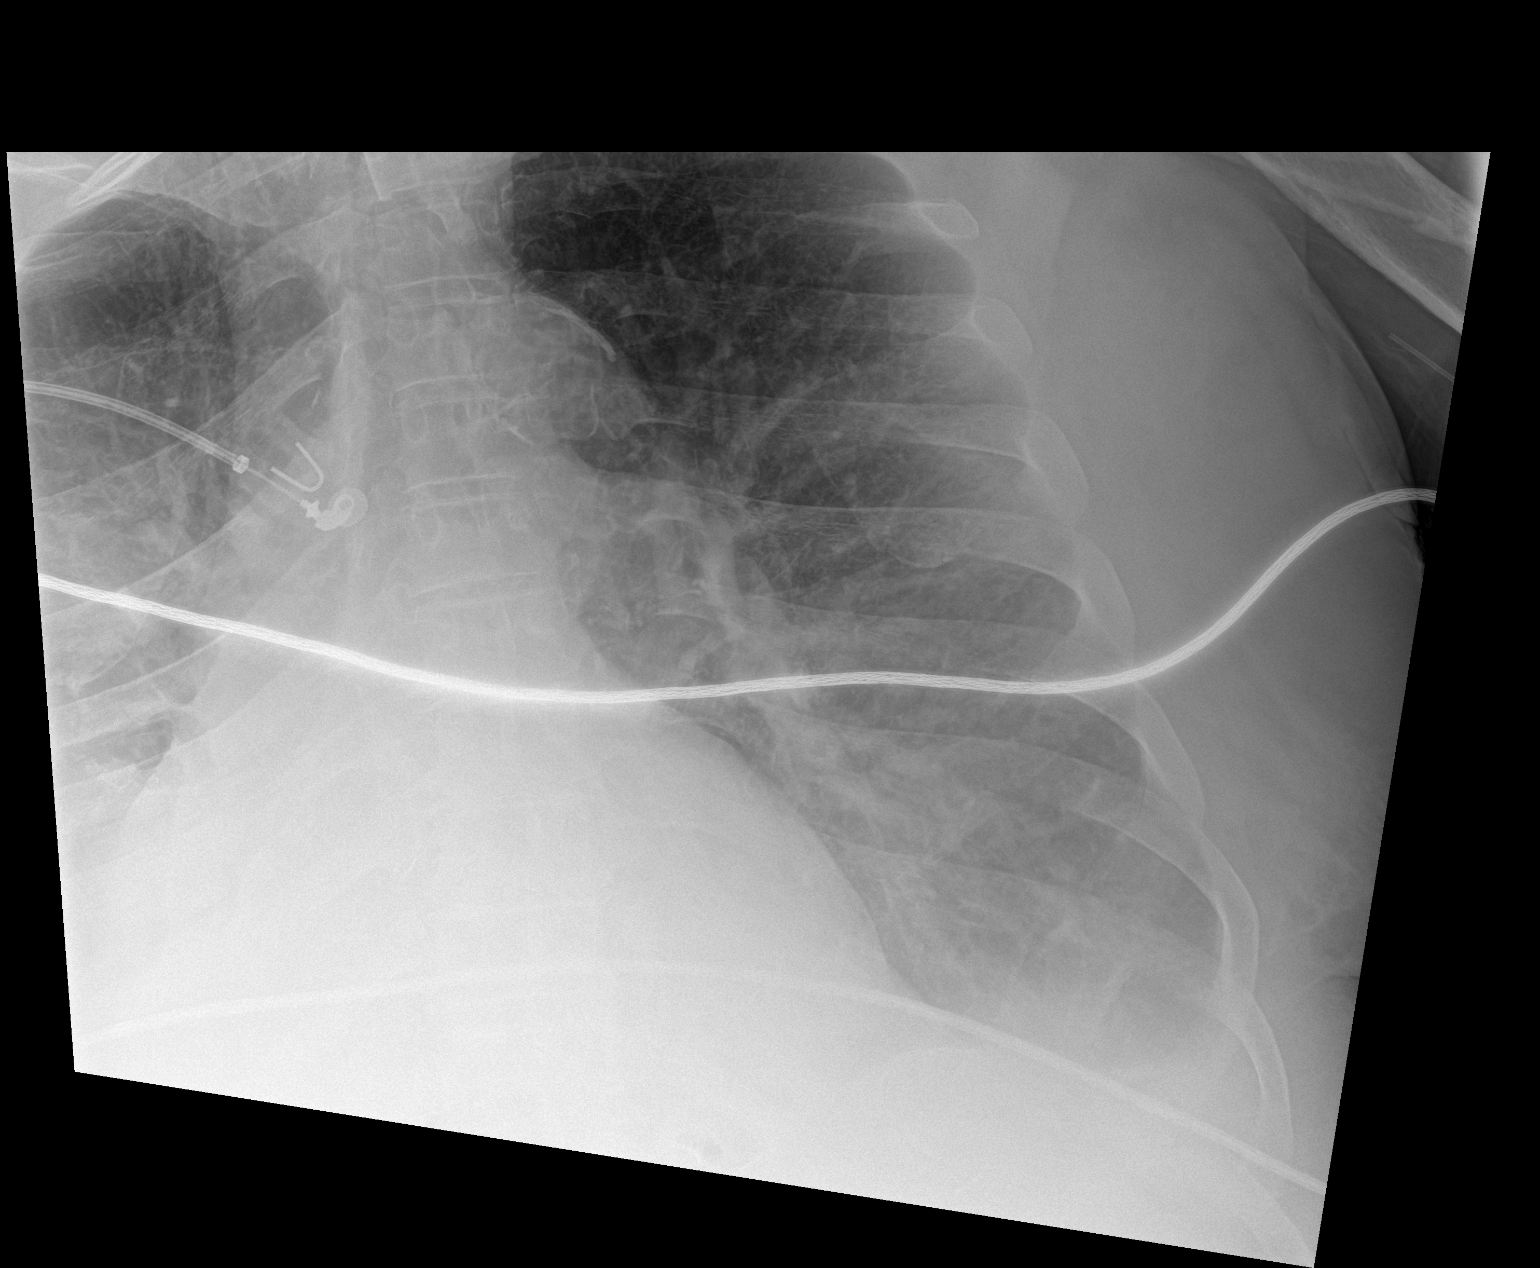

[2 of 2 positions shown; findings below may reference images not displayed]

FINDINGS: ET tube terminates in the mid trachea. Monitoring leads overlie the
patient. Stable cardiomegaly. Persistent large right and moderate
left layering pleural effusions with underlying pulmonary
consolidation. No pneumothorax.
IMPRESSION: Similar-appearing layering pleural effusions and underlying
consolidation.

## 2018-03-13 IMAGING — DX DG CHEST 1V PORT
1 series · 1 of 1 positions shown · non-contrast
Comparison: 10/11/2016

CLINICAL DATA: Status post right-sided thoracentesis.

EXAM:
PORTABLE CHEST 1 VIEW

[chest ap]
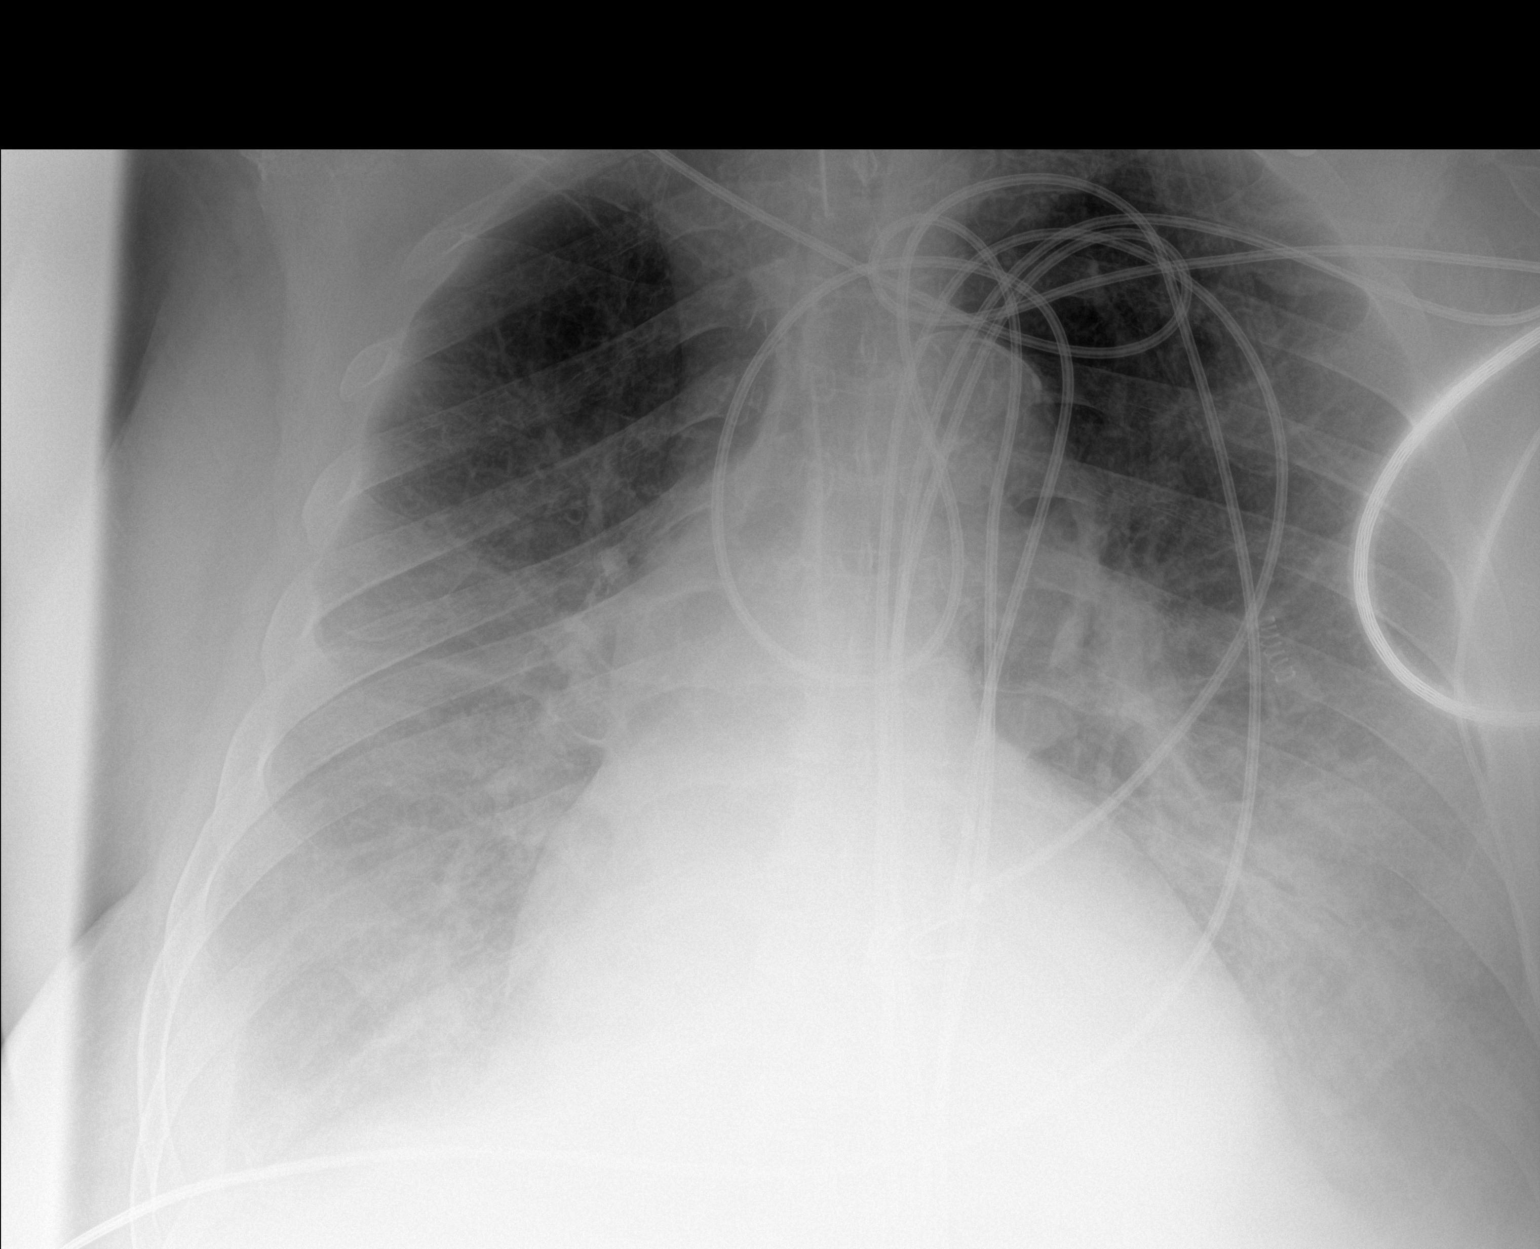

[1 of 1 positions shown; findings below may reference images not displayed]

FINDINGS: Tracheostomy appropriately positioned. Mild cardiomegaly. Layering
right greater than left pleural effusions. Decreased on the right
and similar on the left. No pneumothorax. Interstitial edema is
similar, mild. Improved right and persistent left base airspace
disease.
IMPRESSION: Decreased right-sided pleural effusion.

Otherwise, similar congestive heart failure with bilateral pleural
fluid and bibasilar airspace disease.
